# Patient Record
Sex: Female | Born: 1990
Health system: Southern US, Community
[De-identification: ages and names within clinical notes are randomized; demographics above are authoritative.]

## PROBLEM LIST (undated history)

## (undated) DIAGNOSIS — F32A Depression, unspecified: Secondary | ICD-10-CM

## (undated) DIAGNOSIS — D649 Anemia, unspecified: Secondary | ICD-10-CM

## (undated) DIAGNOSIS — F419 Anxiety disorder, unspecified: Secondary | ICD-10-CM

## (undated) HISTORY — DX: Anxiety disorder, unspecified: F41.9

## (undated) HISTORY — DX: Depression, unspecified: F32.A

## (undated) HISTORY — PX: WISDOM TOOTH EXTRACTION: SHX21

## (undated) HISTORY — DX: Anemia, unspecified: D64.9

---

## 2011-01-23 ENCOUNTER — Emergency Department (HOSPITAL_BASED_OUTPATIENT_CLINIC_OR_DEPARTMENT_OTHER)
Admission: EM | Admit: 2011-01-23 | Discharge: 2011-01-23 | Disposition: A | Discharge status: Home / Self Care | Payer: Medicaid Other | Attending: Emergency Medicine | Admitting: Emergency Medicine

## 2011-01-23 ENCOUNTER — Emergency Department (INDEPENDENT_AMBULATORY_CARE_PROVIDER_SITE_OTHER): Payer: Medicaid Other

## 2011-01-23 DIAGNOSIS — M25519 Pain in unspecified shoulder: Secondary | ICD-10-CM | POA: Insufficient documentation

## 2011-01-23 DIAGNOSIS — M79609 Pain in unspecified limb: Secondary | ICD-10-CM | POA: Insufficient documentation

## 2011-01-23 DIAGNOSIS — M542 Cervicalgia: Secondary | ICD-10-CM

## 2011-01-28 ENCOUNTER — Emergency Department (HOSPITAL_BASED_OUTPATIENT_CLINIC_OR_DEPARTMENT_OTHER)
Admission: EM | Admit: 2011-01-28 | Discharge: 2011-01-28 | Disposition: A | Discharge status: Home / Self Care | Payer: Medicaid Other | Attending: Emergency Medicine | Admitting: Emergency Medicine

## 2011-01-28 DIAGNOSIS — M25519 Pain in unspecified shoulder: Secondary | ICD-10-CM | POA: Insufficient documentation

## 2011-01-28 DIAGNOSIS — R209 Unspecified disturbances of skin sensation: Secondary | ICD-10-CM | POA: Insufficient documentation

## 2011-01-28 DIAGNOSIS — M542 Cervicalgia: Secondary | ICD-10-CM | POA: Insufficient documentation

## 2011-01-28 DIAGNOSIS — M79609 Pain in unspecified limb: Secondary | ICD-10-CM | POA: Insufficient documentation

## 2011-01-28 DIAGNOSIS — M62838 Other muscle spasm: Secondary | ICD-10-CM | POA: Insufficient documentation

## 2011-06-02 ENCOUNTER — Emergency Department (HOSPITAL_BASED_OUTPATIENT_CLINIC_OR_DEPARTMENT_OTHER)
Admission: EM | Admit: 2011-06-02 | Discharge: 2011-06-03 | Disposition: A | Discharge status: Home / Self Care | Payer: Medicaid Other | Attending: Emergency Medicine | Admitting: Emergency Medicine

## 2011-06-02 DIAGNOSIS — X58XXXA Exposure to other specified factors, initial encounter: Secondary | ICD-10-CM | POA: Insufficient documentation

## 2011-06-02 DIAGNOSIS — R51 Headache: Secondary | ICD-10-CM | POA: Insufficient documentation

## 2011-06-02 DIAGNOSIS — L089 Local infection of the skin and subcutaneous tissue, unspecified: Secondary | ICD-10-CM | POA: Insufficient documentation

## 2011-06-02 DIAGNOSIS — Y9289 Other specified places as the place of occurrence of the external cause: Secondary | ICD-10-CM | POA: Insufficient documentation

## 2011-06-02 DIAGNOSIS — IMO0002 Reserved for concepts with insufficient information to code with codable children: Secondary | ICD-10-CM | POA: Insufficient documentation

## 2015-12-14 NOTE — L&D Delivery Note (Signed)
Delivery Note At 8:30 PM a viable female, "Emma Spencer", was delivered via Vaginal, Spontaneous Delivery (Presentation: OA restituting to ROA).  APGARS: 8, 9; weight pending.   Placenta status: Spontaneous, intact.  Cord:  WNL, with the following complications: None. Cord pH: NA  Anesthesia: Epidural Episiotomy: None Lacerations: 2nd degree Suture Repair: 2-0 vicryl CT-1, 3-0 vicryl SH Est. Blood Loss (mL): 350  Mom to postpartum.  Baby to Couplet care / Skin to Skin. Mom plans to breastfeed and desires Micronor for contraception.  Takes Zoloft for anxiety and depression, wishes to continue pp.     Sherre ScarletKimberly Theodoros Stjames, CNM 09/15/16, 9:37 PM

## 2016-04-12 ENCOUNTER — Telehealth: Payer: Self-pay | Admitting: Cardiology

## 2016-04-12 NOTE — Telephone Encounter (Signed)
Received [facility] for appointment on 04/19/16 with Dr SwazilandJordan.  [facility] for Dr Elvis CoilJordan's schedule on 04/19/16. lp

## 2016-04-19 ENCOUNTER — Encounter: Payer: Self-pay | Admitting: Cardiology

## 2016-04-19 ENCOUNTER — Ambulatory Visit (INDEPENDENT_AMBULATORY_CARE_PROVIDER_SITE_OTHER): Payer: Medicaid Other | Admitting: Cardiology

## 2016-04-19 VITALS — BP 94/64 | HR 82 | Ht 66.0 in | Wt 187.5 lb

## 2016-04-19 DIAGNOSIS — R002 Palpitations: Secondary | ICD-10-CM | POA: Diagnosis not present

## 2016-04-19 HISTORY — DX: Palpitations: R00.2

## 2016-04-19 NOTE — Progress Notes (Signed)
Cardiology Office Note   Date:  04/19/2016   ID:  Emma Spencer, DOB Jul 04, 1991, MRN [id number]  PCP:  Michael Litter, MD  Cardiologist:   Peter Swaziland, MD   Chief Complaint  Patient presents with  . New Evaluation    having some heart palpitions, has shortness of breath with heart palpitations, no edema, no pain or cramping in legs, has lightheaded and dizziness       History of Present Illness: Emma Spencer is a 25 y.o. female who presents for evaluation of palpitations at the request of Dr. Normand Sloop. She is now [redacted] weeks pregnant with her first pregnancy. She has noted palpitations over the past month. They occur daily. She notes heart racing and pounding. HR increases to 118-119. Symptoms associated with lightheadedness, SOB and feeling tired. No triggers. Relieved with sitting down. Lasts about 10-15 minutes but may last longer if unable to sit. Avoids caffeine and decongestants. She is a non smoker. She is otherwise in excellent health.    Past Medical History  Diagnosis Date  . Anemia     No past surgical history on file.   Current Outpatient Prescriptions  Medication Sig Dispense Refill  . docusate sodium (COLACE) 100 MG capsule Take 100 mg by mouth daily.    . Prenat w/o A-FeCbGl-DSS-FA-DHA (CITRANATAL ASSURE) 35-1 & 300 MG tablet TAKE BOTH CAPS BY MOUTH ONCE DAILY  11  . Prenatal Vit-Fe Fumarate-FA (PRENATAL VITAMIN PLUS LOW IRON) 27-1 MG TABS Take 1 tablet by mouth daily.  11   No current facility-administered medications for this visit.    Allergies:   Review of patient's allergies indicates no known allergies.    Social History:  The patient  reports that she has never smoked. She has never used smokeless tobacco. She reports that she does not drink alcohol.   Family History:  The patient's family history includes Hypertension in her father and mother.    ROS:  Please see the history of present illness.   Otherwise, review of systems are positive for  none.   All other systems are reviewed and negative.    PHYSICAL EXAM: VS:  BP 94/64 mmHg  Pulse 82  Ht  (1.676 m)  Wt 85.049 kg (187 lb 8 oz)  BMI 30.28 kg/m2 , BMI Body mass index is 30.28 kg/(m^2). GEN: Well nourished, well developed, in no acute distress HEENT: normal Neck: no JVD, carotid bruits, or masses Cardiac: RRR; no murmurs, rubs, or gallops,no edema  Respiratory:  clear to auscultation bilaterally, normal work of breathing GI: soft, nontender, nondistended, + BS MS: no deformity or atrophy Skin: warm and dry, no rash Neuro:  Strength and sensation are intact Psych: euthymic mood, full affect   EKG:  EKG is ordered today. The ekg ordered today demonstrates NSR rate 83. Nonspecific TWA. I have personally reviewed and interpreted this study.    Recent Labs: No results found for requested labs within last 365 days.    Lipid Panel No results found for: CHOL, TRIG, HDL, CHOLHDL, VLDL, LDLCALC, LDLDIRECT    Wt Readings from Last 3 Encounters:  04/19/16 85.049 kg (187 lb 8 oz)      Other studies Reviewed: Additional studies/ records that were reviewed today include: None. Review of the above records demonstrates: N/A   ASSESSMENT AND PLAN:  1.  Palpitations. Etiology unclear. Possibly exacerbated by hormonal changes with pregnancy. Will have her wear a Holter monitor for 48 hours to document rhythm. Will have further recommendations  once results known.    Current medicines are reviewed at length with the patient today.  The patient does not have concerns regarding medicines.  The following changes have been made:  no change  Labs/ tests ordered today include:  Orders Placed This Encounter  Procedures  . EKG 12-Lead   48 hour Holter monitor.   Disposition:   FU TBD  Signed, Peter SwazilandJordan, MD  04/19/2016 10:32 AM    [facility] [address], [place], [place], [id number] Phone [phone], Fax [phone]

## 2016-04-19 NOTE — Patient Instructions (Signed)
We will have you wear a monitor.

## 2016-04-26 ENCOUNTER — Ambulatory Visit (INDEPENDENT_AMBULATORY_CARE_PROVIDER_SITE_OTHER): Payer: Medicaid Other

## 2016-04-26 DIAGNOSIS — R002 Palpitations: Secondary | ICD-10-CM | POA: Diagnosis not present

## 2016-05-05 ENCOUNTER — Telehealth: Payer: Self-pay | Admitting: Cardiology

## 2016-05-05 NOTE — Telephone Encounter (Signed)
New message   Pt is calling for 48 hour holter  results

## 2016-05-05 NOTE — Telephone Encounter (Signed)
Received call from patient.Holter monitor results given.

## 2016-05-05 NOTE — Telephone Encounter (Signed)
Returned call to patient no answer.LMTC. 

## 2016-05-11 ENCOUNTER — Other Ambulatory Visit (HOSPITAL_COMMUNITY): Payer: Self-pay | Admitting: Obstetrics and Gynecology

## 2016-05-11 DIAGNOSIS — Z3A23 23 weeks gestation of pregnancy: Secondary | ICD-10-CM

## 2016-05-11 DIAGNOSIS — Z3689 Encounter for other specified antenatal screening: Secondary | ICD-10-CM

## 2016-05-11 DIAGNOSIS — Q049 Congenital malformation of brain, unspecified: Secondary | ICD-10-CM

## 2016-05-14 ENCOUNTER — Encounter (HOSPITAL_COMMUNITY): Payer: Self-pay

## 2016-05-18 ENCOUNTER — Ambulatory Visit (HOSPITAL_COMMUNITY): Admission: RE | Admit: 2016-05-18 | Payer: Medicaid Other | Source: Ambulatory Visit

## 2016-05-18 ENCOUNTER — Ambulatory Visit (HOSPITAL_COMMUNITY)
Admission: RE | Admit: 2016-05-18 | Discharge: 2016-05-18 | Disposition: A | Discharge status: Home / Self Care | Payer: Medicaid Other | Source: Ambulatory Visit | Attending: Obstetrics and Gynecology | Admitting: Obstetrics and Gynecology

## 2016-05-18 ENCOUNTER — Encounter (HOSPITAL_COMMUNITY): Payer: Self-pay

## 2016-05-18 DIAGNOSIS — Z3689 Encounter for other specified antenatal screening: Secondary | ICD-10-CM

## 2016-05-18 DIAGNOSIS — Z36 Encounter for antenatal screening of mother: Secondary | ICD-10-CM | POA: Insufficient documentation

## 2016-05-18 DIAGNOSIS — O358XX0 Maternal care for other (suspected) fetal abnormality and damage, not applicable or unspecified: Secondary | ICD-10-CM | POA: Insufficient documentation

## 2016-05-18 DIAGNOSIS — Z3A23 23 weeks gestation of pregnancy: Secondary | ICD-10-CM | POA: Diagnosis not present

## 2016-05-18 DIAGNOSIS — Q049 Congenital malformation of brain, unspecified: Secondary | ICD-10-CM

## 2016-05-26 ENCOUNTER — Encounter (HOSPITAL_COMMUNITY): Payer: Self-pay

## 2016-05-26 ENCOUNTER — Other Ambulatory Visit (HOSPITAL_COMMUNITY): Payer: Self-pay

## 2016-06-08 LAB — OB RESULTS CONSOLE GC/CHLAMYDIA
CHLAMYDIA, DNA PROBE: NEGATIVE
Gonorrhea: NEGATIVE

## 2016-06-08 LAB — OB RESULTS CONSOLE RPR: RPR: NONREACTIVE

## 2016-06-08 LAB — OB RESULTS CONSOLE ANTIBODY SCREEN: Antibody Screen: NEGATIVE

## 2016-06-08 LAB — OB RESULTS CONSOLE HIV ANTIBODY (ROUTINE TESTING): HIV: NONREACTIVE

## 2016-06-08 LAB — OB RESULTS CONSOLE HEPATITIS B SURFACE ANTIGEN: Hepatitis B Surface Ag: NEGATIVE

## 2016-06-08 LAB — OB RESULTS CONSOLE ABO/RH: RH Type: POSITIVE

## 2016-06-08 LAB — OB RESULTS CONSOLE RUBELLA ANTIBODY, IGM: Rubella: IMMUNE

## 2016-08-27 LAB — OB RESULTS CONSOLE GBS: GBS: NEGATIVE

## 2016-09-14 ENCOUNTER — Encounter (HOSPITAL_COMMUNITY): Payer: Self-pay | Admitting: *Deleted

## 2016-09-14 ENCOUNTER — Inpatient Hospital Stay (HOSPITAL_COMMUNITY)
Admission: AD | Admit: 2016-09-14 | Discharge: 2016-09-14 | Disposition: A | Discharge status: Home / Self Care | Payer: Medicaid Other | Source: Ambulatory Visit | Attending: Obstetrics and Gynecology | Admitting: Obstetrics and Gynecology

## 2016-09-14 MED ORDER — OXYCODONE-ACETAMINOPHEN 5-325 MG PO TABS
2.0000 | ORAL_TABLET | Freq: Once | ORAL | Status: AC
  Administered 2016-09-14: 2 via ORAL
  Filled 2016-09-14: qty 2

## 2016-09-14 NOTE — MAU Note (Signed)
Contractions started this morning around 0700 went to the dr she was 2cm, they have gotten worse denies LOF, some spotting.

## 2016-09-14 NOTE — Discharge Instructions (Signed)
Braxton Hicks Contractions °Contractions of the uterus can occur throughout pregnancy. Contractions are not always a sign that you are in labor.  °WHAT ARE BRAXTON HICKS CONTRACTIONS?  °Contractions that occur before labor are called Braxton Hicks contractions, or false labor. Toward the end of pregnancy (32-34 weeks), these contractions can develop more often and may become more forceful. This is not true labor because these contractions do not result in opening (dilatation) and thinning of the cervix. They are sometimes difficult to tell apart from true labor because these contractions can be forceful and people have different pain tolerances. You should not feel embarrassed if you go to the hospital with false labor. Sometimes, the only way to tell if you are in true labor is for your health care provider to look for changes in the cervix. °If there are no prenatal problems or other health problems associated with the pregnancy, it is completely safe to be sent home with false labor and await the onset of true labor. °HOW CAN YOU TELL THE DIFFERENCE BETWEEN TRUE AND FALSE LABOR? °False Labor °· The contractions of false labor are usually shorter and not as hard as those of true labor.   °· The contractions are usually irregular.   °· The contractions are often felt in the front of the lower abdomen and in the groin.   °· The contractions may go away when you walk around or change positions while lying down.   °· The contractions get weaker and are shorter lasting as time goes on.   °· The contractions do not usually become progressively stronger, regular, and closer together as with true labor.   °True Labor °· Contractions in true labor last 30-70 seconds, become very regular, usually become more intense, and increase in frequency.   °· The contractions do not go away with walking.   °· The discomfort is usually felt in the top of the uterus and spreads to the lower abdomen and low back.   °· True labor can be  determined by your health care provider with an exam. This will show that the cervix is dilating and getting thinner.   °WHAT TO REMEMBER °· Keep up with your usual exercises and follow other instructions given by your health care provider.   °· Take medicines as directed by your health care provider.   °· Keep your regular prenatal appointments.   °· Eat and drink lightly if you think you are going into labor.   °· If Braxton Hicks contractions are making you uncomfortable:   °¨ Change your position from lying down or resting to walking, or from walking to resting.   °¨ Sit and rest in a tub of warm water.   °¨ Drink 2-3 glasses of water. Dehydration may cause these contractions.   °¨ Do slow and deep breathing several times an hour.   °WHEN SHOULD I SEEK IMMEDIATE MEDICAL CARE? °Seek immediate medical care if: °· Your contractions become stronger, more regular, and closer together.   °· You have fluid leaking or gushing from your vagina.   °· You have a fever.   °· You pass blood-tinged mucus.   °· You have vaginal bleeding.   °· You have continuous abdominal pain.   °· You have low back pain that you never had before.   °· You feel your baby's head pushing down and causing pelvic pressure.   °· Your baby is not moving as much as it used to.   °  °This information is not intended to replace advice given to you by your health care provider. Make sure you discuss any questions you have with your health care   provider. °  °Document Released: 11/29/2005 Document Revised: 12/04/2013 Document Reviewed: 09/10/2013 °Elsevier Interactive Patient Education ©2016 [employer] ° °

## 2016-09-15 ENCOUNTER — Inpatient Hospital Stay (HOSPITAL_COMMUNITY)
Admission: AD | Admit: 2016-09-15 | Discharge: 2016-09-17 | DRG: 775 | Disposition: A | Discharge status: Home / Self Care | Payer: Medicaid Other | Source: Ambulatory Visit | Attending: Obstetrics & Gynecology | Admitting: Obstetrics & Gynecology

## 2016-09-15 ENCOUNTER — Inpatient Hospital Stay (HOSPITAL_COMMUNITY): Payer: Medicaid Other | Admitting: Anesthesiology

## 2016-09-15 ENCOUNTER — Encounter (HOSPITAL_COMMUNITY): Payer: Self-pay | Admitting: *Deleted

## 2016-09-15 DIAGNOSIS — F418 Other specified anxiety disorders: Secondary | ICD-10-CM | POA: Diagnosis present

## 2016-09-15 DIAGNOSIS — Z3A40 40 weeks gestation of pregnancy: Secondary | ICD-10-CM | POA: Diagnosis not present

## 2016-09-15 DIAGNOSIS — Z8659 Personal history of other mental and behavioral disorders: Secondary | ICD-10-CM

## 2016-09-15 DIAGNOSIS — Z3403 Encounter for supervision of normal first pregnancy, third trimester: Secondary | ICD-10-CM | POA: Diagnosis present

## 2016-09-15 DIAGNOSIS — Z8249 Family history of ischemic heart disease and other diseases of the circulatory system: Secondary | ICD-10-CM | POA: Diagnosis not present

## 2016-09-15 DIAGNOSIS — O99344 Other mental disorders complicating childbirth: Secondary | ICD-10-CM | POA: Diagnosis present

## 2016-09-15 LAB — CBC
HEMATOCRIT: 37.6 % (ref 36.0–46.0)
HEMOGLOBIN: 13.1 g/dL (ref 12.0–15.0)
MCH: 29.2 pg (ref 26.0–34.0)
MCHC: 34.8 g/dL (ref 30.0–36.0)
MCV: 83.7 fL (ref 78.0–100.0)
Platelets: 327 10*3/uL (ref 150–400)
RBC: 4.49 MIL/uL (ref 3.87–5.11)
RDW: 15.2 % (ref 11.5–15.5)
WBC: 14.2 10*3/uL — AB (ref 4.0–10.5)

## 2016-09-15 LAB — TYPE AND SCREEN
ABO/RH(D): O POS
Antibody Screen: NEGATIVE

## 2016-09-15 LAB — ABO/RH: ABO/RH(D): O POS

## 2016-09-15 MED ORDER — SOD CITRATE-CITRIC ACID 500-334 MG/5ML PO SOLN: 30.0000 mL | ORAL | Status: DC | PRN

## 2016-09-15 MED ORDER — ACETAMINOPHEN 325 MG PO TABS: 650.0000 mg | ORAL_TABLET | ORAL | Status: DC | PRN

## 2016-09-15 MED ORDER — OXYTOCIN 40 UNITS IN LACTATED RINGERS INFUSION - SIMPLE MED
1.0000 m[IU]/min | INTRAVENOUS | Status: DC
  Administered 2016-09-15: 2 m[IU]/min via INTRAVENOUS

## 2016-09-15 MED ORDER — PHENYLEPHRINE 40 MCG/ML (10ML) SYRINGE FOR IV PUSH (FOR BLOOD PRESSURE SUPPORT)
80.0000 ug | PREFILLED_SYRINGE | INTRAVENOUS | Status: DC | PRN
  Filled 2016-09-15: qty 5
  Filled 2016-09-15: qty 10

## 2016-09-15 MED ORDER — LACTATED RINGERS IV SOLN: 500.0000 mL | Freq: Once | INTRAVENOUS | Status: DC

## 2016-09-15 MED ORDER — TERBUTALINE SULFATE 1 MG/ML IJ SOLN
0.2500 mg | Freq: Once | INTRAMUSCULAR | Status: DC | PRN
  Filled 2016-09-15: qty 1

## 2016-09-15 MED ORDER — ONDANSETRON HCL 4 MG/2ML IJ SOLN: 4.0000 mg | Freq: Four times a day (QID) | INTRAMUSCULAR | Status: DC | PRN

## 2016-09-15 MED ORDER — DIPHENHYDRAMINE HCL 50 MG/ML IJ SOLN: 12.5000 mg | INTRAMUSCULAR | Status: DC | PRN

## 2016-09-15 MED ORDER — PHENYLEPHRINE 40 MCG/ML (10ML) SYRINGE FOR IV PUSH (FOR BLOOD PRESSURE SUPPORT)
80.0000 ug | PREFILLED_SYRINGE | INTRAVENOUS | Status: DC | PRN
  Filled 2016-09-15: qty 5

## 2016-09-15 MED ORDER — LIDOCAINE HCL (PF) 1 % IJ SOLN
INTRAMUSCULAR | Status: DC | PRN
  Administered 2016-09-15 (×2): 4 mL

## 2016-09-15 MED ORDER — OXYCODONE-ACETAMINOPHEN 5-325 MG PO TABS: 2.0000 | ORAL_TABLET | ORAL | Status: DC | PRN

## 2016-09-15 MED ORDER — EPHEDRINE 5 MG/ML INJ
10.0000 mg | INTRAVENOUS | Status: DC | PRN
  Filled 2016-09-15: qty 4

## 2016-09-15 MED ORDER — FENTANYL 2.5 MCG/ML BUPIVACAINE 1/10 % EPIDURAL INFUSION (WH - ANES)
14.0000 mL/h | INTRAMUSCULAR | Status: DC | PRN
  Administered 2016-09-15 (×2): 14 mL/h via EPIDURAL
  Filled 2016-09-15 (×2): qty 125

## 2016-09-15 MED ORDER — FENTANYL 2.5 MCG/ML BUPIVACAINE 1/10 % EPIDURAL INFUSION (WH - ANES): 14.0000 mL/h | INTRAMUSCULAR | Status: DC | PRN

## 2016-09-15 MED ORDER — OXYCODONE-ACETAMINOPHEN 5-325 MG PO TABS: 1.0000 | ORAL_TABLET | ORAL | Status: DC | PRN

## 2016-09-15 MED ORDER — LACTATED RINGERS IV SOLN
INTRAVENOUS | Status: DC
  Administered 2016-09-15: 11:00:00 via INTRAVENOUS
  Administered 2016-09-15: 125 mL/h via INTRAVENOUS

## 2016-09-15 MED ORDER — OXYTOCIN BOLUS FROM INFUSION: 500.0000 mL | Freq: Once | INTRAVENOUS | Status: DC

## 2016-09-15 MED ORDER — LACTATED RINGERS IV SOLN
500.0000 mL | INTRAVENOUS | Status: DC | PRN
  Administered 2016-09-15 (×2): 500 mL via INTRAVENOUS

## 2016-09-15 MED ORDER — OXYTOCIN 40 UNITS IN LACTATED RINGERS INFUSION - SIMPLE MED
2.5000 [IU]/h | INTRAVENOUS | Status: DC
  Administered 2016-09-15: 2.5 [IU]/h via INTRAVENOUS
  Filled 2016-09-15: qty 1000

## 2016-09-15 MED ORDER — LIDOCAINE HCL (PF) 1 % IJ SOLN
30.0000 mL | INTRAMUSCULAR | Status: DC | PRN
  Filled 2016-09-15: qty 30

## 2016-09-15 MED ORDER — IBUPROFEN 600 MG PO TABS
600.0000 mg | ORAL_TABLET | Freq: Four times a day (QID) | ORAL | Status: DC
  Administered 2016-09-15 – 2016-09-17 (×7): 600 mg via ORAL
  Filled 2016-09-15 (×7): qty 1

## 2016-09-15 NOTE — H&P (Signed)
Emma MoseMelissa Spencer is a 25 y.o. female presenting for labor.G1P0 @ 2040 W 2 D presented with contractions.  Denies vaginal bleeding or leakage of fluid.  Has just received epidural. LMP 12/08/15/  [facility]    OB History    Gravida Para Term Preterm AB Living   1         0   SAB TAB Ectopic Multiple Live Births                 Past Medical History:  Diagnosis Date  . Anemia    History reviewed. No pertinent surgical history. Family History: family history includes Hypertension in her father and mother. Social History:  reports that she has never smoked. She has never used smokeless tobacco. She reports that she does not drink alcohol or use drugs.     Maternal Diabetes: No Genetic Screening: Normal Maternal Ultrasounds/Referrals: Abnormal:  Findings:   Other:  Fetus with enlarged cisterna magnum, subsequent ultrasound at [facility] showed normal anatomy.  Last EFW8/15/17: 5lb 4 oz (74th%).  Fetal Ultrasounds or other Referrals:  None Maternal Substance Abuse:  No Significant Maternal Medications:  Meds include: Zoloft for depression and anxiety.  Significant Maternal Lab Results:  None Other Comments:  None  ROS  Constitutional: Denies fevers/chills Cardiovascular: Denies chest pain or palpitations Pulmonary: Denies coughing or wheezing Gastrointestinal: Denies nausea, vomiting or diarrhea Genitourinary: Denies pelvic pain, unusual vaginal bleeding, unusual vaginal discharge, dysuria, urgency or frequency. +uterine contractions.  Musculoskeletal: Denies muscle or joint aches and pain.  Neurology: Denies abnormal sensations such as tingling or numbness.   History Dilation: 7 Effacement (%): 80 Station: -1 Exam by:: MD Kaiyon Hynes  AROM performed @ 12.20 pm: blood tinged fluid, small amount.  Blood pressure 126/64, pulse 69, temperature 98.5 F (36.9 C), temperature source Oral, resp. rate 16, height 5\' 6"  (1.676 m), weight 98 kg (216 lb), last menstrual period 12/08/2015, SpO2 100  %. Exam Physical Exam  General: No acute distress Abdomen: Soft, gravid Extremities; Warm and well perfused. No edema.  NST:  130 BL, mod variability, reactive TOCO: Q 4-5 minutes apart EFW 7.5 lbs.    Prenatal labs: ABO, Rh: --/--/O POS (10/04 1037) Antibody: NEG (10/04 1037) Rubella: Immune (06/27 0000) RPR: Nonreactive (06/27 0000)  HBsAg: Negative (06/27 0000)  HIV: Non-reactive (06/27 0000)  GBS: Negative (09/15 0000)    Assessment/Plan: 25 y/o G1P0 in labor,   Admit to Labor and delivery Anticipate NSVD   Konrad FelixKULWA,Trask Vosler WAKURU, MD.  09/15/2016, 12:29 PM

## 2016-09-15 NOTE — Anesthesia Pain Management Evaluation Note (Signed)
  CRNA Pain Management Visit Note  Patient: Emma MoseMelissa Ratledge, 25 y.o., female  "Hello I am a member of the anesthesia team at [facility]. We have an anesthesia team available at all times to provide care throughout the hospital, including epidural management and anesthesia for C-section. I don't know your plan for the delivery whether it a natural birth, water birth, IV sedation, nitrous supplementation, doula or epidural, but we want to meet your pain goals."   1.Was your pain managed to your expectations on prior hospitalizations?   No prior hospitalizations  2.What is your expectation for pain management during this hospitalization?     Epidural  3.How can we help you reach that goal? Epidural in situ.  Record the patient's initial score and the patient's pain goal.   Pain: 7--epidural placed recently, pain decreasing to more tolerable, L&D RN at bedside  Pain Goal: 6 [facility] wants you to be able to say your pain was always managed very well.  Lasha Echeverria L 09/15/2016

## 2016-09-15 NOTE — MAU Note (Signed)
Was seen in MAU yesterday with UC's. States contractions have continued. Noticed pink tinged mucous, no leaking.

## 2016-09-15 NOTE — Anesthesia Procedure Notes (Addendum)
Epidural Patient location during procedure: OB  Staffing Anesthesiologist: Jazzie Trampe Performed: anesthesiologist   Preanesthetic Checklist Completed: patient identified, pre-op evaluation, timeout performed, IV checked, risks and benefits discussed and monitors and equipment checked  Epidural Patient position: sitting Prep: site prepped and draped and DuraPrep Patient monitoring: heart rate Approach: midline Location: L2-L3 Injection technique: LOR air and LOR saline  Needle:  Needle type: Tuohy  Needle gauge: 17 G Needle length: 9 cm Needle insertion depth: 6 cm Catheter type: closed end flexible Catheter size: 19 Gauge Catheter at skin depth: 12 cm Test dose: negative  Assessment Sensory level: T8 Events: blood not aspirated, injection not painful, no injection resistance, negative IV test and no paresthesia  Additional Notes Reason for block:procedure for pain     

## 2016-09-15 NOTE — Progress Notes (Signed)
C/o dizziness when getting OOB to bathroom with near fall. Patient assisted to wheelchair from stedy by spouse and RN. Vitals obtained, all WNL. 500cc IVF bolus administered. Will continue to monitor.

## 2016-09-15 NOTE — Anesthesia Preprocedure Evaluation (Signed)
Anesthesia Evaluation  Patient identified by MRN, date of birth, ID band Patient awake    Reviewed: Allergy & Precautions, NPO status , Patient's Chart, lab work & pertinent test results  Airway Mallampati: II  TM Distance: >3 FB Neck ROM: Full    Dental no notable dental hx.    Pulmonary neg pulmonary ROS,    Pulmonary exam normal breath sounds clear to auscultation       Cardiovascular negative cardio ROS Normal cardiovascular exam Rhythm:Regular Rate:Normal     Neuro/Psych negative neurological ROS  negative psych ROS   GI/Hepatic negative GI ROS, Neg liver ROS,   Endo/Other  negative endocrine ROS  Renal/GU negative Renal ROS     Musculoskeletal negative musculoskeletal ROS (+)   Abdominal (+) + obese,   Peds  Hematology  (+) anemia ,   Anesthesia Other Findings   Reproductive/Obstetrics (+) Pregnancy                             Anesthesia Physical Anesthesia Plan  ASA: II  Anesthesia Plan: Epidural   Post-op Pain Management:    Induction:   Airway Management Planned:   Additional Equipment:   Intra-op Plan:   Post-operative Plan:   Informed Consent: I have reviewed the patients History and Physical, chart, labs and discussed the procedure including the risks, benefits and alternatives for the proposed anesthesia with the patient or authorized representative who has indicated his/her understanding and acceptance.     Plan Discussed with:   Anesthesia Plan Comments:         Anesthesia Quick Evaluation  

## 2016-09-15 NOTE — Anesthesia Rounding Note (Signed)
  CRNA Epidural Rounding Note  Patient: Emma Spencer, 25 y.o., female  Patient's current pain level: Pain Score: 7  (09/15/16 1436)  Agreed upon pain management level: 6  Epidural intervention: No   Comments:   Dorthey Depace 09/15/2016

## 2016-09-16 LAB — CBC
HEMATOCRIT: 31.1 % — AB (ref 36.0–46.0)
HEMOGLOBIN: 10.6 g/dL — AB (ref 12.0–15.0)
MCH: 28.7 pg (ref 26.0–34.0)
MCHC: 34.1 g/dL (ref 30.0–36.0)
MCV: 84.3 fL (ref 78.0–100.0)
Platelets: 269 10*3/uL (ref 150–400)
RBC: 3.69 MIL/uL — AB (ref 3.87–5.11)
RDW: 15.7 % — ABNORMAL HIGH (ref 11.5–15.5)
WBC: 15.5 10*3/uL — AB (ref 4.0–10.5)

## 2016-09-16 LAB — HIV ANTIBODY (ROUTINE TESTING W REFLEX): HIV Screen 4th Generation wRfx: NONREACTIVE

## 2016-09-16 LAB — RPR: RPR: NONREACTIVE

## 2016-09-16 MED ORDER — DIPHENHYDRAMINE HCL 25 MG PO CAPS
25.0000 mg | ORAL_CAPSULE | Freq: Four times a day (QID) | ORAL | Status: DC | PRN
Start: 2016-09-16 — End: 2016-09-17

## 2016-09-16 MED ORDER — COCONUT OIL OIL
1.0000 "application " | TOPICAL_OIL | Status: DC | PRN
  Filled 2016-09-16: qty 120

## 2016-09-16 MED ORDER — SIMETHICONE 80 MG PO CHEW: 80.0000 mg | CHEWABLE_TABLET | ORAL | Status: DC | PRN

## 2016-09-16 MED ORDER — FERROUS SULFATE 325 (65 FE) MG PO TABS
325.0000 mg | ORAL_TABLET | Freq: Two times a day (BID) | ORAL | Status: DC
  Administered 2016-09-16 – 2016-09-17 (×3): 325 mg via ORAL
  Filled 2016-09-16 (×3): qty 1

## 2016-09-16 MED ORDER — WITCH HAZEL-GLYCERIN EX PADS: 1.0000 "application " | MEDICATED_PAD | CUTANEOUS | Status: DC | PRN

## 2016-09-16 MED ORDER — ONDANSETRON HCL 4 MG PO TABS: 4.0000 mg | ORAL_TABLET | ORAL | Status: DC | PRN

## 2016-09-16 MED ORDER — OXYCODONE-ACETAMINOPHEN 5-325 MG PO TABS: 1.0000 | ORAL_TABLET | ORAL | Status: DC | PRN

## 2016-09-16 MED ORDER — PRENATAL MULTIVITAMIN CH
1.0000 | ORAL_TABLET | Freq: Every day | ORAL | Status: DC
  Administered 2016-09-16 – 2016-09-17 (×2): 1 via ORAL
  Filled 2016-09-16 (×2): qty 1

## 2016-09-16 MED ORDER — TETANUS-DIPHTH-ACELL PERTUSSIS 5-2.5-18.5 LF-MCG/0.5 IM SUSP: 0.5000 mL | Freq: Once | INTRAMUSCULAR | Status: DC

## 2016-09-16 MED ORDER — SENNOSIDES-DOCUSATE SODIUM 8.6-50 MG PO TABS
2.0000 | ORAL_TABLET | ORAL | Status: DC
  Administered 2016-09-16: 2 via ORAL
  Filled 2016-09-16: qty 2

## 2016-09-16 MED ORDER — DIBUCAINE 1 % RE OINT: 1.0000 "application " | TOPICAL_OINTMENT | RECTAL | Status: DC | PRN

## 2016-09-16 MED ORDER — ONDANSETRON HCL 4 MG/2ML IJ SOLN: 4.0000 mg | INTRAMUSCULAR | Status: DC | PRN

## 2016-09-16 MED ORDER — SERTRALINE HCL 50 MG PO TABS
50.0000 mg | ORAL_TABLET | Freq: Every day | ORAL | Status: DC
  Administered 2016-09-16 – 2016-09-17 (×2): 50 mg via ORAL
  Filled 2016-09-16 (×3): qty 1

## 2016-09-16 MED ORDER — BENZOCAINE-MENTHOL 20-0.5 % EX AERO
1.0000 "application " | INHALATION_SPRAY | CUTANEOUS | Status: DC | PRN
  Administered 2016-09-16: 1 via TOPICAL
  Filled 2016-09-16: qty 56

## 2016-09-16 MED ORDER — OXYCODONE-ACETAMINOPHEN 5-325 MG PO TABS: 2.0000 | ORAL_TABLET | ORAL | Status: DC | PRN

## 2016-09-16 MED ORDER — ZOLPIDEM TARTRATE 5 MG PO TABS: 5.0000 mg | ORAL_TABLET | Freq: Every evening | ORAL | Status: DC | PRN

## 2016-09-16 MED ORDER — ACETAMINOPHEN 325 MG PO TABS: 650.0000 mg | ORAL_TABLET | ORAL | Status: DC | PRN

## 2016-09-16 NOTE — Progress Notes (Signed)
Post Partum Day 1 Subjective:  Well. Lochia are normal. Voiding, ambulating, tolerating normal diet. nursing going well.  Objective: Blood pressure (!) 115/59, pulse 79, temperature 98.2 F (36.8 C), resp. rate 18, height 5\' 6"  (1.676 m), weight 216 lb (98 kg), last menstrual period 12/08/2015, SpO2 99 %, unknown if currently breastfeeding.  Physical Exam:  General: normal Lochia: appropriate Uterine Fundus: 0/2 firm non-tender  Extremities: No evidence of DVT seen on physical exam. Edema minimal     Recent Labs  09/15/16 1037  HGB 13.1  HCT 37.6    Assessment/Plan: Normal Post-partum. Continue routine post-partum care. Anticipate discharge tomorrow    LOS: 1 day   Dmarcus Decicco A MD 09/16/2016, 7:57 AM

## 2016-09-16 NOTE — Anesthesia Postprocedure Evaluation (Signed)
Anesthesia Post Note  Patient: Emma Spencer  Procedure(s) Performed: * No procedures listed *  Patient location during evaluation: Mother Baby Anesthesia Type: Epidural Level of consciousness: awake Pain management: pain level controlled Vital Signs Assessment: post-procedure vital signs reviewed and stable Respiratory status: spontaneous breathing Cardiovascular status: stable Postop Assessment: no headache, no backache, spinal receding and patient able to bend at knees Anesthetic complications: no     Last Vitals:  Vitals:   09/16/16 0100 09/16/16 0539  BP: (!) 114/58 (!) 115/59  Pulse: 89 79  Resp: 20 18  Temp: 36.7 C 36.8 C    Last Pain:  Vitals:   09/16/16 0539  TempSrc:   PainSc: 4    Pain Goal:                 Edison PaceWILKERSON,Islah Eve

## 2016-09-16 NOTE — Lactation Note (Signed)
This note was copied from a baby's chart. Lactation Consultation Note  Patient Name: [name] Today's Date: 09/16/2016 Reason for consult: Initial assessment   P1 reports baby feeding well but mom having some discomfort when baby is at breast.   Baby was placed STS with mom.  Mom was shown hand expression; colostrum noted afterwards.  Baby was put into football position on left side and baby latched well but mom reported pain.  Mom was taught how to unlatch baby if pinching or pain felt or if baby's latch felt too shallow.  LC repositioned baby into cross cradle hold.  Mom reports feeling better in this position on the left side.  LC used jaw massage to assist in widening the baby's latch when at breast.  Good suck swallow pattern observe when at breast.  BF basics reviewed with FOB and mom.  Lactation pamphlet given to mom along with BF resource sheet. OP LC resources explained to mom. LC encouraged mom  to call out with concerns or questions with breastfeeding.        Maternal Data Has patient been taught Hand Expression?: Yes Does the patient have breastfeeding experience prior to this delivery?: No  Feeding Feeding Type: Breast Fed Length of feed: 20 min (still nursing when LC left room)  LATCH Score/Interventions Latch: Grasps breast easily, tongue down, lips flanged, rhythmical sucking. Intervention(s): Adjust position;Assist with latch;Breast massage;Breast compression (changed from football to cross. c/ for pt. comfort)  Audible Swallowing: A few with stimulation Intervention(s): Skin to skin;Hand expression;Alternate breast massage (colostrum see with hand exp. and alt.breast colostrum seen when nursing)  Type of Nipple: Everted at rest and after stimulation  Comfort (Breast/Nipple): Filling, red/small blisters or bruises, mild/mod discomfort (mom expresses some discomfort/ enc. ebm to nipples)  Problem noted: Mild/Moderate discomfort Interventions (Mild/moderate  discomfort): Hand expression;Hand massage  Hold (Positioning): Assistance needed to correctly position infant at breast and maintain latch.  LATCH Score: 7  Lactation Tools Discussed/Used     Consult Status Consult Status: Follow-up Date: 09/16/16 Follow-up type: In-patient    Emma Spencer 09/16/2016, 3:14 PM

## 2016-09-16 NOTE — Progress Notes (Signed)
UR chart review completed.  

## 2016-09-16 NOTE — Discharge Instructions (Signed)

## 2016-09-17 MED ORDER — IBUPROFEN 600 MG PO TABS: 600.0000 mg | ORAL_TABLET | Freq: Four times a day (QID) | ORAL | 0 refills | Status: DC

## 2016-09-17 NOTE — Discharge Summary (Signed)
Obstetric Discharge Summary Reason for Admission: onset of labor Prenatal Procedures: none Intrapartum Procedures: spontaneous vaginal delivery Postpartum Procedures: none Complications-Operative and Postpartum: 2 degree perineal laceration  Delivery Note At 8:30 PM a viable female, "Emma Spencer",was delivered via Vaginal, Spontaneous Delivery (Presentation: OA restituting to ROA). APGARS: 8, 9; weight pending.  Placenta status: Spontaneous, intact.Cord: WNL,with the following complications: None. Cord pH: NA  Anesthesia: Epidural Episiotomy: None Lacerations: 2nd degree Suture Repair: 2-0 vicryl CT-1, 3-0 vicryl SH Est. Blood Loss (mL): 350  Mom to postpartum. Baby to Couplet care / Skin to Skin. Mom plans to breastfeed and desires Micronor for contraception.  Takes Zoloft for anxiety and depression, wishes to continue pp.     Emma Spencer, CNM 09/15/16, 9:37 PM  Hospital Course:  Active Problems:   Second-degree perineal laceration, with delivery   H/O: depression (controlled w/ Zoloft)   H/O anxiety disorder (controlled w/ Zoloft)   Emma Spencer is a 25 y.o. G1P1001 s/p SVD.  Patient was admitted 09/15/16.  She has postpartum course that was uncomplicated including no problems with ambulating, PO intake, urination, pain, or bleeding. The pt feels ready to go home and  will be discharged with outpatient follow-up.   Today: No acute events overnight.  Pt denies problems with ambulating, voiding or po intake.  She denies nausea or vomiting.  Pain is well controlled.  She has had flatus. She has not had bowel movement.  Lochia Small.  Plan for birth control is oral progesterone-only contraceptive.  Method of Feeding: breast  Physical Exam:  BP (!) 116/48 (BP Location: Right Arm)   Pulse 82   Temp 97.7 F (36.5 C) (Oral)   Resp 17   Ht 5\' 6"  (1.676 m)   Wt 216 lb (98 kg)   LMP 12/08/2015   SpO2 99%   Breastfeeding? Unknown   BMI 34.86 kg/m   General: alert and cooperative Lochia: appropriate Uterine Fundus: firm Incision:  DVT Evaluation: No evidence of DVT seen on physical exam.  H/H: Lab Results  Component Value Date/Time   HGB 10.6 (L) 09/16/2016 05:13 AM   HCT 31.1 (L) 09/16/2016 05:13 AM    Discharge Diagnoses: Term Pregnancy-delivered  Discharge Information: Date: 09/17/2016 Activity: pelvic rest Diet: routine  Medications: Ibuprofen Breast feeding:  Yes Condition: stable Instructions: refer to handout Discharge to: home      Medication List    TAKE these medications   CITRANATAL ASSURE 35-1 & 300 MG tablet Take 2 tablets by mouth daily.   docusate sodium 100 MG capsule Commonly known as:  COLACE Take 100 mg by mouth daily.   ibuprofen 600 MG tablet Commonly known as:  ADVIL,MOTRIN Take 1 tablet (600 mg total) by mouth every 6 (six) hours.   sertraline 50 MG tablet Commonly known as:  ZOLOFT Take 50 mg by mouth daily.      Follow-up Information    [facility]. Schedule an appointment as soon as possible for a visit in 6 week(s).   Specialty:  Obstetrics and Gynecology Contact information: [address] [place] [id number] [phone]           Emma PinkLori A Spencer, CNM 09/17/2016,6:37 AM

## 2016-09-17 NOTE — Progress Notes (Signed)
MOB was referred for history of depression/anxiety. * Referral screened out by Clinical Social Worker because none of the following criteria appear to apply: ~ History of anxiety/depression during this pregnancy, or of post-partum depression. ~ Diagnosis of anxiety and/or depression within last 3 years OR * MOB's symptoms currently being treated with medication and/or therapy. Please contact the Clinical Social Worker if needs arise, or if MOB requests.  MOB has Rx for Zoloft.   

## 2016-09-17 NOTE — Lactation Note (Signed)
This note was copied from a baby's chart. Lactation Consultation Note: Mom reports baby fed a lot through the night. Reassurance given. Reports nipples are sore- they look intact- reviewed wide open mouth and keeping the  the baby close to the breast  throughout the feeding. Mom going back to work- reviewed pumping for back to work  Has Medela pump for home, Baby latched well and mom reports some pain with initial latched than then eases off/  Baby still nursing as I left room. Reviewed our phone number to call with questions, OP appointments and BFSG as resources for support after DC. To call prn.  Patient Name: Emma Spencer [id number] Date: 09/17/2016 Reason for consult: Follow-up assessment   Maternal Data Formula Feeding for Exclusion: No Has patient been taught Hand Expression?: Yes Does the patient have breastfeeding experience prior to this delivery?: No  Feeding Feeding Type: Breast Fed  LATCH Score/Interventions Latch: Grasps breast easily, tongue down, lips flanged, rhythmical sucking.  Audible Swallowing: A few with stimulation  Type of Nipple: Everted at rest and after stimulation  Comfort (Breast/Nipple): Filling, red/small blisters or bruises, mild/mod discomfort  Problem noted: Mild/Moderate discomfort Interventions (Mild/moderate discomfort): Hand expression  Hold (Positioning): Assistance needed to correctly position infant at breast and maintain latch.  LATCH Score: 7  Lactation Tools Discussed/Used WIC Program: No   Consult Status Consult Status: Complete    Pamelia Hoit 09/17/2016, 10:21 AM

## 2016-09-17 NOTE — Plan of Care (Signed)
Problem: Education: Goal: Knowledge of condition will improve Outcome: Completed/Met Date Met: 09/17/16 Discharge education and paperwork discussed. Reasons to call the MD discussed. Pt denies questions.

## 2016-12-10 ENCOUNTER — Encounter: Payer: Self-pay | Admitting: Cardiology

## 2017-09-12 IMAGING — US US MFM OB DETAIL+14 WK
1 series · 14 of 28 positions shown · non-contrast
Comparison: none

[Series 1: us mfm ob detail+14 wk · 122 acquisitions, 14 frames shown]
[im 5/122]
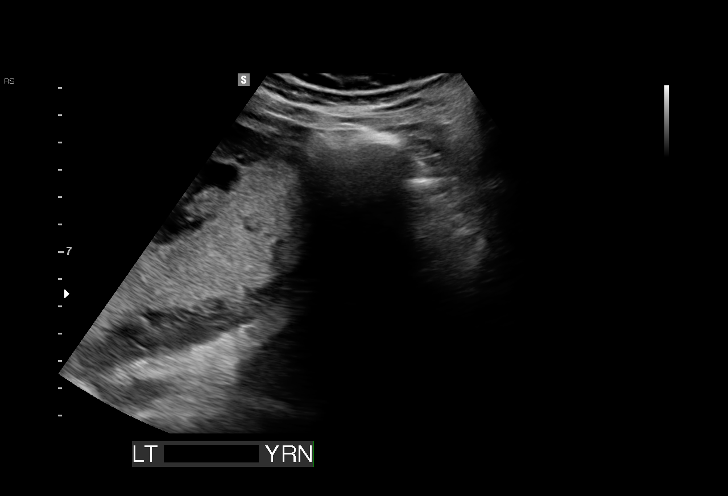
[im 14/122]
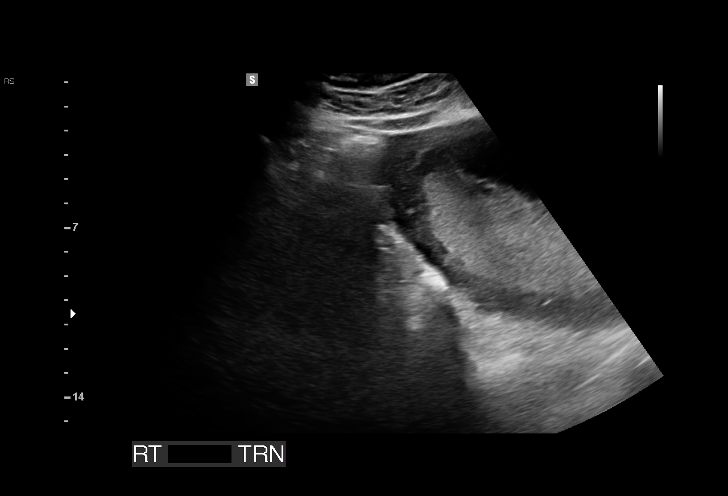
[im 23/122]
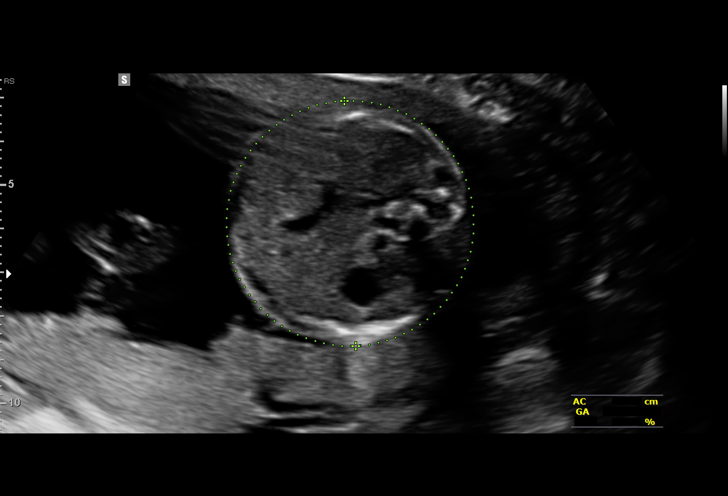
[im 32/122]
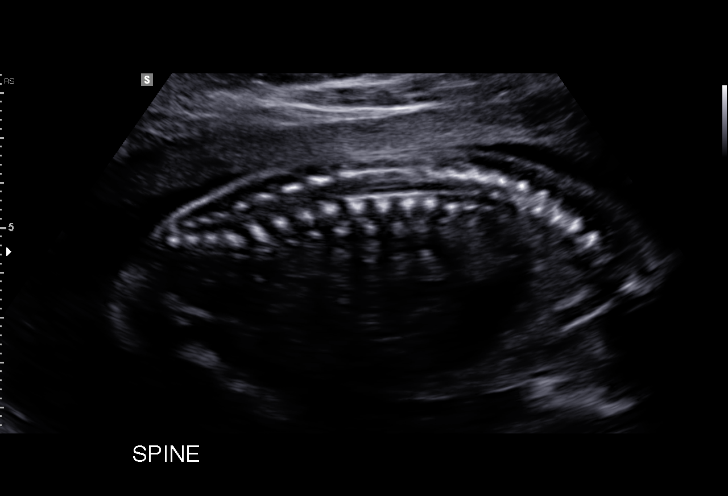
[im 41/122]
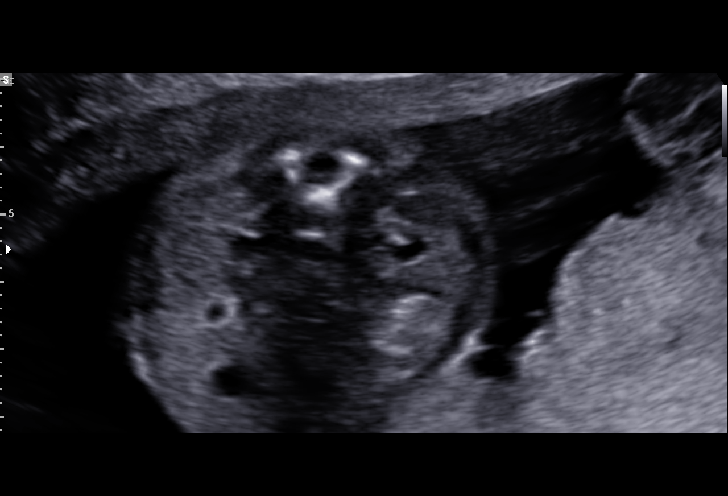
[im 50/122]
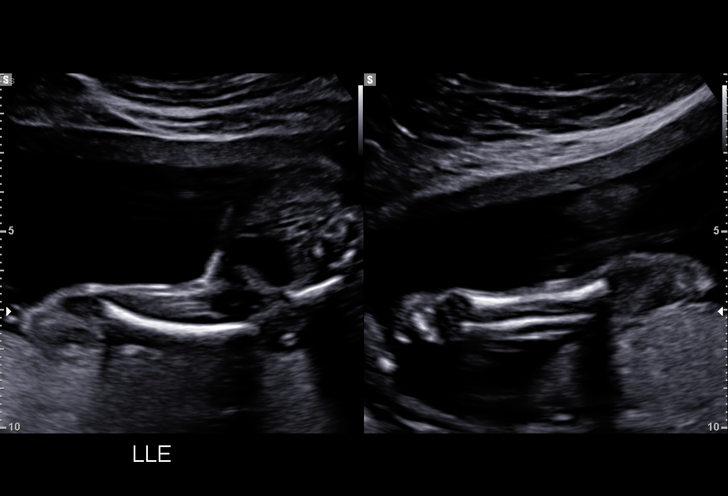
[im 59/122]
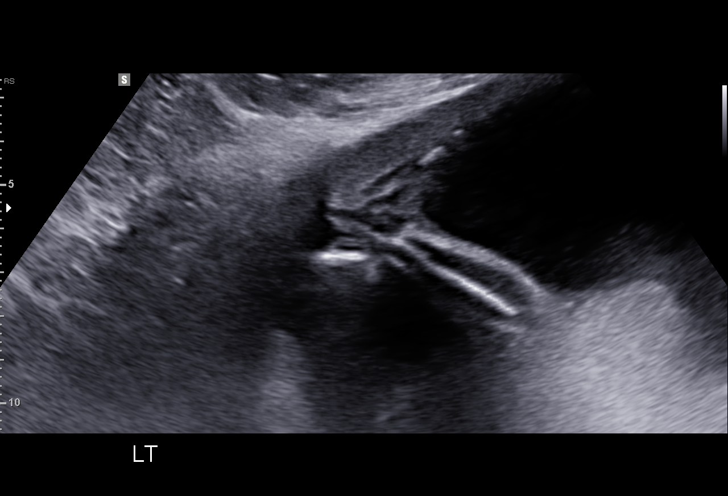
[im 68/122]
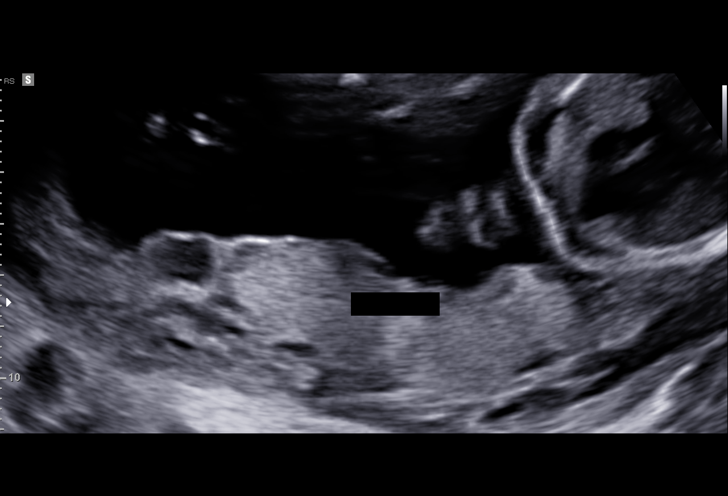
[im 77/122]
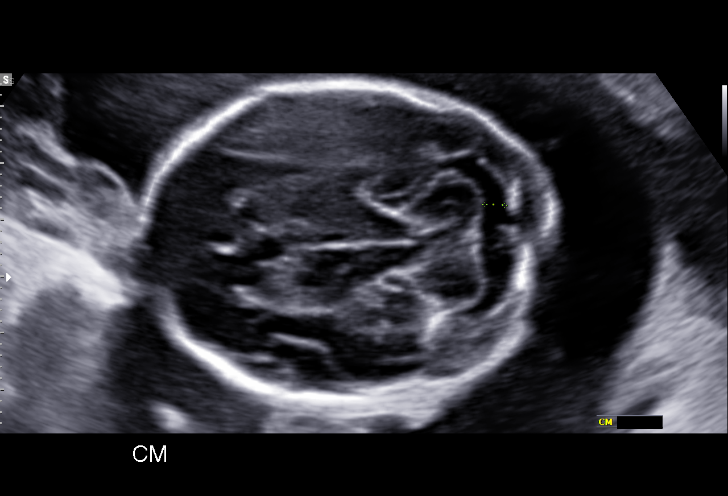
[im 86/122]
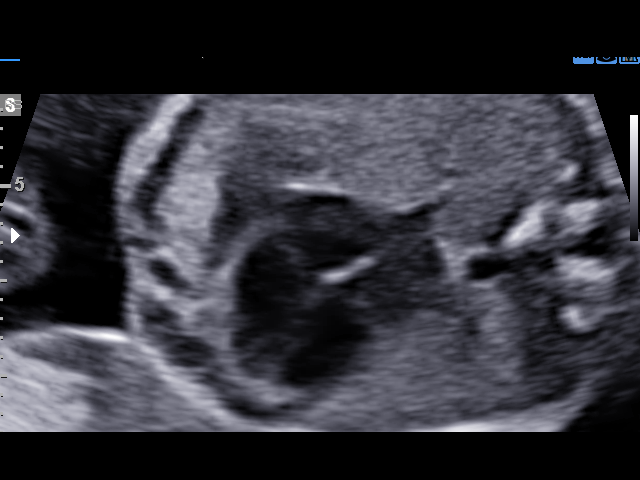
[im 95/122]
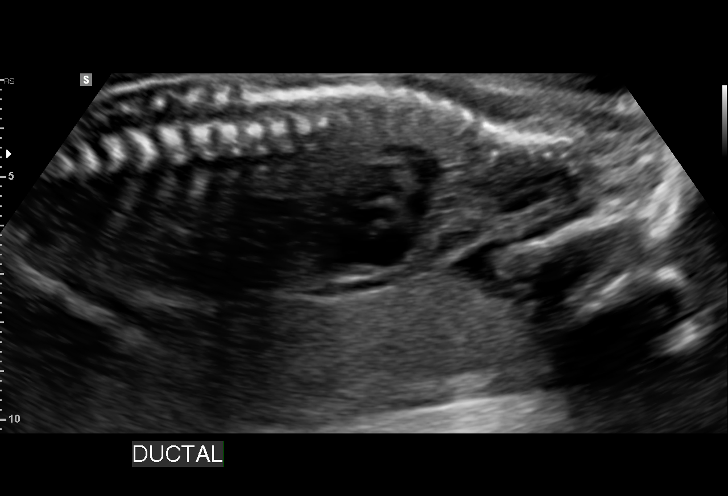
[im 104/122]
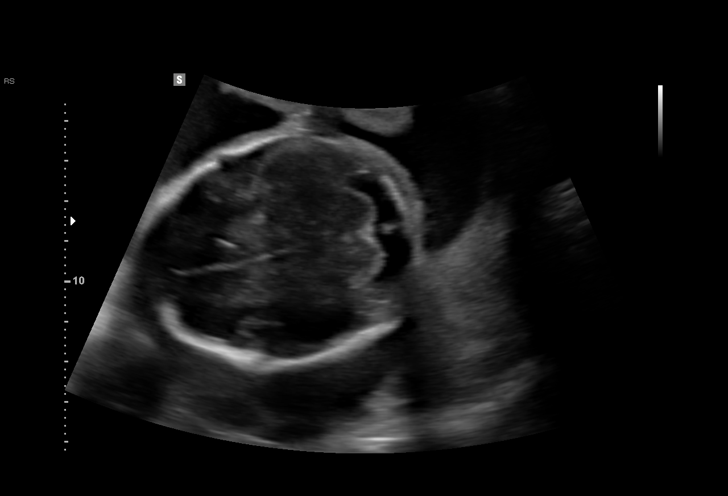
[im 113/122]
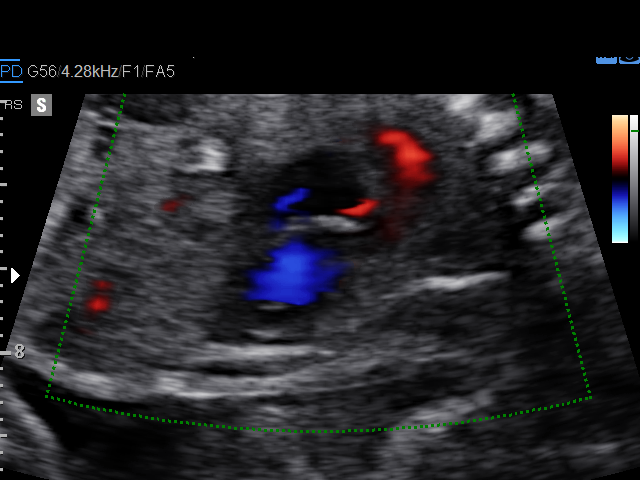
[im 122/122]
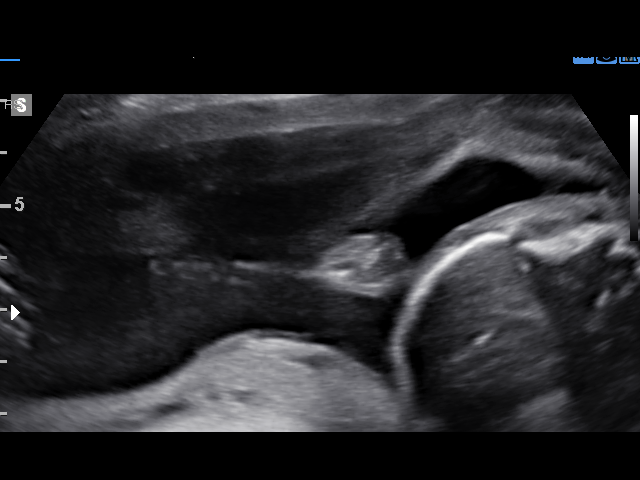

[14 of 28 positions shown; findings below may reference images not displayed]

Obstetrics &
Gynecology
[address]
[place].

1  JESSIE HOPPER            [id number]       [id number]     [id number]
Indications

23 weeks gestation of pregnancy
Detailed fetal anatomic survey                 Z36
Fetal abnormality - other known or
suspected (? dolichocephaly, ? enlarged CM)
OB History

Gravidity:    1
Fetal Evaluation

Num Of Fetuses:     1
Fetal Heart         149
Rate(bpm):
Cardiac Activity:   Observed
Presentation:       Cephalic
Placenta:           Posterior, above cervical os
P. Cord Insertion:  Visualized, central

Amniotic Fluid
AFI FV:      Subjectively within normal limits

Largest Pocket(cm)
4.91
Biometry

BPD:      56.4  mm     G. Age:  23w 2d         48  %    CI:         69.58  %    70 - 86
FL/HC:       18.9  %    19.2 -
HC:      215.8  mm     G. Age:  23w 4d         54  %    HC/AC:       1.21       1.05 -
AC:      178.5  mm     G. Age:  22w 5d         29  %    FL/BPD:      72.2  %    71 - 87
FL:       40.7  mm     G. Age:  23w 1d         40  %    FL/AC:       22.8  %    20 - 24
HUM:      37.7  mm     G. Age:  23w 2d         45  %
CER:      25.3  mm     G. Age:  23w 2d         52  %
CM:        4.7  mm

Est. FW:     554   gm     1 lb 4 oz     51  %
Gestational Age

LMP:           23w 1d        Date:  12/08/15                 EDD:    09/13/16
U/S Today:     23w 1d                                        EDD:    09/13/16
Best:          23w 1d     Det. By:  LMP  (12/08/15)          EDD:    09/13/16
Anatomy

Cranium:               Appears normal         Aortic Arch:            Appears normal
Cavum:                 Appears normal         Ductal Arch:            Appears normal
Ventricles:            Appears normal         Diaphragm:              Appears normal
Choroid Plexus:        Appears normal         Stomach:                Appears normal, left
sided
Cerebellum:            Appears normal         Abdomen:                Appears normal
Posterior Fossa:       Appears normal         Abdominal Wall:         Appears nml (cord
insert, abd wall)
Nuchal Fold:           Not applicable (>20    Cord Vessels:           Appears normal (3
wks GA)                                        vessel cord)
Face:                  Orbits nl; profile not Kidneys:                Appear normal
well visualized
Lips:                  Appears normal         Bladder:                Appears normal
Thoracic:              Appears normal         Spine:                  Appears normal
Heart:                 Appears normal         Upper Extremities:      Appears normal
(4CH, axis, and
situs)
RVOT:                  Appears normal         Lower Extremities:      Appears normal
LVOT:                  Appears normal

Other:  Fetus appears to be a female. Heels visualized. LUE not well
visualized.
Cervix Uterus Adnexa

Cervix
Length:           3.36  cm.
Normal appearance by transabdominal scan.

Uterus
No abnormality visualized.

Left Ovary
Not visualized.

Right Ovary
Not visualized.

Adnexa:       No abnormality visualized. No adnexal mass
visualized.
Impression

SIUP at 23+1 weeks
Normal detailed fetal anatomy; limited views of profile;
intracranial structures seen and appeared normal
Normal amniotic fluid volume
Measurements consistent with LMP; EFW at the 51st %tile
Recommendations

Follow-up ultrasounds as clinically indicated.

## 2018-08-07 LAB — OB RESULTS CONSOLE ANTIBODY SCREEN: Antibody Screen: NEGATIVE

## 2018-08-07 LAB — OB RESULTS CONSOLE GC/CHLAMYDIA
Chlamydia: NEGATIVE
GC PROBE AMP, GENITAL: NEGATIVE

## 2018-08-07 LAB — OB RESULTS CONSOLE GBS: STREP GROUP B AG: POSITIVE

## 2018-08-07 LAB — OB RESULTS CONSOLE ABO/RH: RH TYPE: POSITIVE

## 2018-08-07 LAB — OB RESULTS CONSOLE RPR: RPR: NONREACTIVE

## 2018-08-07 LAB — OB RESULTS CONSOLE HEPATITIS B SURFACE ANTIGEN: Hepatitis B Surface Ag: NEGATIVE

## 2018-08-07 LAB — OB RESULTS CONSOLE RUBELLA ANTIBODY, IGM: Rubella: IMMUNE

## 2018-08-07 LAB — OB RESULTS CONSOLE HIV ANTIBODY (ROUTINE TESTING): HIV: NONREACTIVE

## 2019-02-26 ENCOUNTER — Encounter (HOSPITAL_COMMUNITY): Payer: Self-pay | Admitting: *Deleted

## 2019-02-26 ENCOUNTER — Telehealth (HOSPITAL_COMMUNITY): Payer: Self-pay | Admitting: *Deleted

## 2019-02-26 NOTE — Telephone Encounter (Signed)
Preadmission screen  

## 2019-02-27 ENCOUNTER — Inpatient Hospital Stay (HOSPITAL_COMMUNITY)
Admission: AD | Admit: 2019-02-27 | Discharge: 2019-02-28 | DRG: 807 | Disposition: A | Discharge status: Home / Self Care | Payer: Managed Care, Other (non HMO) | Attending: Obstetrics and Gynecology | Admitting: Obstetrics and Gynecology

## 2019-02-27 ENCOUNTER — Encounter (HOSPITAL_COMMUNITY): Payer: Self-pay | Admitting: *Deleted

## 2019-02-27 DIAGNOSIS — O99824 Streptococcus B carrier state complicating childbirth: Secondary | ICD-10-CM | POA: Diagnosis present

## 2019-02-27 DIAGNOSIS — Z3490 Encounter for supervision of normal pregnancy, unspecified, unspecified trimester: Secondary | ICD-10-CM

## 2019-02-27 DIAGNOSIS — Z3A38 38 weeks gestation of pregnancy: Secondary | ICD-10-CM | POA: Diagnosis not present

## 2019-02-27 DIAGNOSIS — O26893 Other specified pregnancy related conditions, third trimester: Secondary | ICD-10-CM | POA: Diagnosis present

## 2019-02-27 HISTORY — DX: Encounter for supervision of normal pregnancy, unspecified, unspecified trimester: Z34.90

## 2019-02-27 LAB — CBC
HCT: 38.1 % (ref 36.0–46.0)
Hemoglobin: 12.4 g/dL (ref 12.0–15.0)
MCH: 27.6 pg (ref 26.0–34.0)
MCHC: 32.5 g/dL (ref 30.0–36.0)
MCV: 84.9 fL (ref 80.0–100.0)
Platelets: 313 10*3/uL (ref 150–400)
RBC: 4.49 MIL/uL (ref 3.87–5.11)
RDW: 13.9 % (ref 11.5–15.5)
WBC: 17.9 10*3/uL — ABNORMAL HIGH (ref 4.0–10.5)
nRBC: 0 % (ref 0.0–0.2)

## 2019-02-27 MED ORDER — LIDOCAINE HCL (PF) 1 % IJ SOLN: 30.0000 mL | INTRAMUSCULAR | Status: DC | PRN

## 2019-02-27 MED ORDER — ONDANSETRON HCL 4 MG/2ML IJ SOLN
4.0000 mg | Freq: Four times a day (QID) | INTRAMUSCULAR | Status: DC | PRN
  Administered 2019-02-28: 4 mg via INTRAVENOUS
  Filled 2019-02-27: qty 2

## 2019-02-27 MED ORDER — LACTATED RINGERS IV SOLN
INTRAVENOUS | Status: DC
  Administered 2019-02-27: 23:00:00 via INTRAVENOUS

## 2019-02-27 MED ORDER — SODIUM CHLORIDE 0.9 % IV SOLN
5.0000 10*6.[IU] | Freq: Once | INTRAVENOUS | Status: AC
  Administered 2019-02-28: 5 10*6.[IU] via INTRAVENOUS
  Filled 2019-02-27: qty 5

## 2019-02-27 MED ORDER — PHENYLEPHRINE 40 MCG/ML (10ML) SYRINGE FOR IV PUSH (FOR BLOOD PRESSURE SUPPORT)
80.0000 ug | PREFILLED_SYRINGE | INTRAVENOUS | Status: DC | PRN
  Administered 2019-02-28: 80 ug via INTRAVENOUS

## 2019-02-27 MED ORDER — LACTATED RINGERS IV SOLN: 500.0000 mL | INTRAVENOUS | Status: DC | PRN

## 2019-02-27 MED ORDER — EPHEDRINE 5 MG/ML INJ: 10.0000 mg | INTRAVENOUS | Status: DC | PRN

## 2019-02-27 MED ORDER — OXYTOCIN 40 UNITS IN NORMAL SALINE INFUSION - SIMPLE MED
2.5000 [IU]/h | INTRAVENOUS | Status: DC
  Administered 2019-02-28: 2.5 [IU]/h via INTRAVENOUS
  Filled 2019-02-27: qty 1000

## 2019-02-27 MED ORDER — FLEET ENEMA 7-19 GM/118ML RE ENEM: 1.0000 | ENEMA | RECTAL | Status: DC | PRN

## 2019-02-27 MED ORDER — PHENYLEPHRINE 40 MCG/ML (10ML) SYRINGE FOR IV PUSH (FOR BLOOD PRESSURE SUPPORT)
80.0000 ug | PREFILLED_SYRINGE | INTRAVENOUS | Status: DC | PRN
  Filled 2019-02-27: qty 10

## 2019-02-27 MED ORDER — SOD CITRATE-CITRIC ACID 500-334 MG/5ML PO SOLN: 30.0000 mL | ORAL | Status: DC | PRN

## 2019-02-27 MED ORDER — DIPHENHYDRAMINE HCL 50 MG/ML IJ SOLN: 12.5000 mg | INTRAMUSCULAR | Status: DC | PRN

## 2019-02-27 MED ORDER — OXYTOCIN BOLUS FROM INFUSION
500.0000 mL | Freq: Once | INTRAVENOUS | Status: DC
  Administered 2019-02-28: 500 mL via INTRAVENOUS

## 2019-02-27 MED ORDER — SERTRALINE HCL 50 MG PO TABS: 50.0000 mg | ORAL_TABLET | Freq: Every day | ORAL | Status: DC

## 2019-02-27 MED ORDER — BUTORPHANOL TARTRATE 1 MG/ML IJ SOLN: 1.0000 mg | INTRAMUSCULAR | Status: DC | PRN

## 2019-02-27 MED ORDER — PENICILLIN G 3 MILLION UNITS IVPB - SIMPLE MED: 3.0000 10*6.[IU] | INTRAVENOUS | Status: DC

## 2019-02-27 MED ORDER — FENTANYL-BUPIVACAINE-NACL 0.5-0.125-0.9 MG/250ML-% EP SOLN
12.0000 mL/h | EPIDURAL | Status: DC | PRN
  Filled 2019-02-27: qty 250

## 2019-02-27 MED ORDER — ACETAMINOPHEN 325 MG PO TABS: 650.0000 mg | ORAL_TABLET | ORAL | Status: DC | PRN

## 2019-02-27 MED ORDER — OXYCODONE-ACETAMINOPHEN 5-325 MG PO TABS: 2.0000 | ORAL_TABLET | ORAL | Status: DC | PRN

## 2019-02-27 MED ORDER — LACTATED RINGERS IV SOLN: 500.0000 mL | Freq: Once | INTRAVENOUS | Status: DC

## 2019-02-27 MED ORDER — HYDROXYZINE HCL 50 MG PO TABS: 50.0000 mg | ORAL_TABLET | Freq: Four times a day (QID) | ORAL | Status: DC | PRN

## 2019-02-27 MED ORDER — OXYCODONE-ACETAMINOPHEN 5-325 MG PO TABS: 1.0000 | ORAL_TABLET | ORAL | Status: DC | PRN

## 2019-02-27 NOTE — MAU Note (Signed)
PT SAYS  STRONG UC SINCE 530PM.   DENIES HSV AND MRSA. GBS- UNSURE.   IS A SURROGATE .  LAST WEEK IN OFFICE - DR GREWAL  3 CM.

## 2019-02-28 ENCOUNTER — Inpatient Hospital Stay (HOSPITAL_COMMUNITY): Payer: Managed Care, Other (non HMO) | Admitting: Anesthesiology

## 2019-02-28 ENCOUNTER — Encounter (HOSPITAL_COMMUNITY): Payer: Self-pay | Admitting: *Deleted

## 2019-02-28 LAB — ABO/RH: ABO/RH(D): O POS

## 2019-02-28 LAB — RPR: RPR Ser Ql: NONREACTIVE

## 2019-02-28 LAB — CBC
HCT: 34.4 % — ABNORMAL LOW (ref 36.0–46.0)
Hemoglobin: 11.7 g/dL — ABNORMAL LOW (ref 12.0–15.0)
MCH: 28.5 pg (ref 26.0–34.0)
MCHC: 34 g/dL (ref 30.0–36.0)
MCV: 83.7 fL (ref 80.0–100.0)
Platelets: 259 10*3/uL (ref 150–400)
RBC: 4.11 MIL/uL (ref 3.87–5.11)
RDW: 14 % (ref 11.5–15.5)
WBC: 25.2 10*3/uL — ABNORMAL HIGH (ref 4.0–10.5)
nRBC: 0 % (ref 0.0–0.2)

## 2019-02-28 LAB — TYPE AND SCREEN
ABO/RH(D): O POS
ANTIBODY SCREEN: NEGATIVE

## 2019-02-28 MED ORDER — BENZOCAINE-MENTHOL 20-0.5 % EX AERO: 1.0000 "application " | INHALATION_SPRAY | CUTANEOUS | Status: DC | PRN

## 2019-02-28 MED ORDER — COCONUT OIL OIL: 1.0000 "application " | TOPICAL_OIL | Status: DC | PRN

## 2019-02-28 MED ORDER — DIBUCAINE 1 % RE OINT: 1.0000 "application " | TOPICAL_OINTMENT | RECTAL | Status: DC | PRN

## 2019-02-28 MED ORDER — OXYCODONE HCL 5 MG PO TABS: 5.0000 mg | ORAL_TABLET | ORAL | Status: DC | PRN

## 2019-02-28 MED ORDER — SIMETHICONE 80 MG PO CHEW: 80.0000 mg | CHEWABLE_TABLET | ORAL | Status: DC | PRN

## 2019-02-28 MED ORDER — ONDANSETRON HCL 4 MG/2ML IJ SOLN: 4.0000 mg | INTRAMUSCULAR | Status: DC | PRN

## 2019-02-28 MED ORDER — ONDANSETRON HCL 4 MG PO TABS: 4.0000 mg | ORAL_TABLET | ORAL | Status: DC | PRN

## 2019-02-28 MED ORDER — DIPHENHYDRAMINE HCL 25 MG PO CAPS: 25.0000 mg | ORAL_CAPSULE | Freq: Four times a day (QID) | ORAL | Status: DC | PRN

## 2019-02-28 MED ORDER — IBUPROFEN 600 MG PO TABS
600.0000 mg | ORAL_TABLET | Freq: Four times a day (QID) | ORAL | Status: DC
  Administered 2019-02-28 (×2): 600 mg via ORAL
  Filled 2019-02-28 (×2): qty 1

## 2019-02-28 MED ORDER — TETANUS-DIPHTH-ACELL PERTUSSIS 5-2.5-18.5 LF-MCG/0.5 IM SUSP: 0.5000 mL | Freq: Once | INTRAMUSCULAR | Status: DC

## 2019-02-28 MED ORDER — LIDOCAINE-EPINEPHRINE (PF) 2 %-1:200000 IJ SOLN
INTRAMUSCULAR | Status: DC | PRN
  Administered 2019-02-28: 2 mL via EPIDURAL
  Administered 2019-02-28: 5 mL via EPIDURAL

## 2019-02-28 MED ORDER — ZOLPIDEM TARTRATE 5 MG PO TABS: 5.0000 mg | ORAL_TABLET | Freq: Every evening | ORAL | Status: DC | PRN

## 2019-02-28 MED ORDER — ACETAMINOPHEN 325 MG PO TABS: 650.0000 mg | ORAL_TABLET | ORAL | Status: DC | PRN

## 2019-02-28 MED ORDER — OXYCODONE HCL 5 MG PO TABS: 10.0000 mg | ORAL_TABLET | ORAL | Status: DC | PRN

## 2019-02-28 MED ORDER — SENNOSIDES-DOCUSATE SODIUM 8.6-50 MG PO TABS: 2.0000 | ORAL_TABLET | ORAL | Status: DC

## 2019-02-28 MED ORDER — WITCH HAZEL-GLYCERIN EX PADS: 1.0000 "application " | MEDICATED_PAD | CUTANEOUS | Status: DC | PRN

## 2019-02-28 MED ORDER — PRENATAL MULTIVITAMIN CH
1.0000 | ORAL_TABLET | Freq: Every day | ORAL | Status: DC
  Administered 2019-02-28: 1 via ORAL
  Filled 2019-02-28: qty 1

## 2019-02-28 MED ORDER — SODIUM CHLORIDE (PF) 0.9 % IJ SOLN
INTRAMUSCULAR | Status: DC | PRN
  Administered 2019-02-28: 12 mL/h via EPIDURAL

## 2019-02-28 NOTE — Progress Notes (Signed)
Delivery Note At 2:20 AM a viable female was delivered via Vaginal, Spontaneous (Presentation:LOA ;  ).  APGAR: 8, 9; weight  .   Placenta status:intact ,to pathology .  Cord: 3 vessels with the following complications: .  Cord pH: pending  Peds present due to thick meconium. Baby had immediate cry>quick bulb suction done and cord immediately cut and baby handed to neonatologist.  Anesthesia:   Episiotomy: None Lacerations: 2nd degree ML Lac repaired Suture Repair: 2.0 vicryl rapide Est. Blood Loss (mL):    Mom to postpartum.  Baby to Couplet care / Skin to Skin.  Roselle Locus II 02/28/2019, 2:38 AM

## 2019-02-28 NOTE — H&P (Signed)
Emma Spencer is a 28 y.o. female presenting for UCs. Denies ROM, fever. Pregnancy uncomplicated. Patient is a surrogate. OB History    Gravida  2   Para  1   Term  1   Preterm      AB      Living  1     SAB      TAB      Ectopic      Multiple  0   Live Births  1          Past Medical History:  Diagnosis Date  . Anemia    Past Surgical History:  Procedure Laterality Date  . WISDOM TOOTH EXTRACTION     Family History: family history includes Anxiety disorder in her maternal aunt and mother; Depression in her maternal aunt and mother; Diabetes in her father; Heart disease in her paternal grandfather; Hypertension in her father and mother; Multiple sclerosis in her maternal aunt; Rheum arthritis in her maternal aunt; Stomach cancer in her maternal grandmother. Social History:  reports that she has never smoked. She has never used smokeless tobacco. She reports that she does not drink alcohol or use drugs.     Maternal Diabetes: No Genetic Screening: Normal Maternal Ultrasounds/Referrals: Normal Fetal Ultrasounds or other Referrals:  None Maternal Substance Abuse:  No Significant Maternal Medications:  None Significant Maternal Lab Results:  None Other Comments:  None  Review of Systems  Constitutional: Negative for fever.  Eyes: Negative for blurred vision.  Gastrointestinal: Negative for abdominal pain.  Neurological: Negative for headaches.   Maternal Medical History:  Reason for admission: Contractions.   Contractions: Onset was 6-12 hours ago.    Fetal activity: Perceived fetal activity is normal.      Dilation: 8 Effacement (%): 90 Station: -1 Exam by:: Dr Henderson Cloud Blood pressure (!) 99/55, pulse 83, temperature 97.9 F (36.6 C), resp. rate 18, SpO2 100 %, unknown if currently breastfeeding. Maternal Exam:  Uterine Assessment: Contraction strength is firm.  Contraction frequency is regular.   Abdomen: Fetal presentation:  vertex     Fetal Exam Fetal Monitor Review: Pattern: accelerations present and early decelerations.       Physical Exam  Cardiovascular: Normal rate and regular rhythm.  Respiratory: Effort normal and breath sounds normal.  GI: Soft.  Neurological: She has normal reflexes.    8/90/-1/vtx AROM > thick mec  FHT 160's, + accelerations, some early decelerations  Prenatal labs: ABO, Rh: --/--/O POS, O POS Performed at [facility], [address], [place], [place] [address]  [phone]) Antibody: NEG (03/17 2313) Rubella: Immune (08/26 0000) RPR: Nonreactive (08/26 0000)  HBsAg: Negative (08/26 0000)  HIV: Non-reactive (08/26 0000)  GBS: Positive (08/26 0000)   Assessment/Plan: 28 yo G2P1 @ 38 5/7 weeks D/W thick meconium>peds at delivery D/W FHT upper normal rate-afebrile, additional 250cc IV bolus given GBBS+-ATB per protocol started   Roselle Locus II 02/28/2019, 1:26 AM

## 2019-02-28 NOTE — Anesthesia Procedure Notes (Signed)
Epidural Patient location during procedure: OB Start time: 02/28/2019 12:06 AM End time: 02/28/2019 12:23 AM  Staffing Anesthesiologist: Lucretia Kern, MD Performed: anesthesiologist   Preanesthetic Checklist Completed: patient identified, pre-op evaluation, timeout performed, IV checked, risks and benefits discussed and monitors and equipment checked  Epidural Patient position: sitting Prep: DuraPrep Patient monitoring: heart rate, continuous pulse ox and blood pressure Approach: midline Location: L3-L4 Injection technique: LOR air  Needle:  Needle type: Tuohy  Needle gauge: 17 G Needle length: 9 cm Needle insertion depth: 5 cm Catheter type: closed end flexible Catheter size: 19 Gauge Catheter at skin depth: 10 cm Test dose: negative and 2% lidocaine with Epi 1:200 K  Assessment Events: blood not aspirated, injection not painful, no injection resistance, negative IV test and no paresthesia  Additional Notes Reason for block:procedure for pain

## 2019-02-28 NOTE — Anesthesia Preprocedure Evaluation (Signed)
Anesthesia Evaluation  Patient identified by MRN, date of birth, ID band Patient awake    Reviewed: Allergy & Precautions, H&P , NPO status , Patient's Chart, lab work & pertinent test results  History of Anesthesia Complications Negative for: history of anesthetic complications  Airway Mallampati: II  TM Distance: >3 FB Neck ROM: full    Dental no notable dental hx.    Pulmonary neg pulmonary ROS,    Pulmonary exam normal        Cardiovascular negative cardio ROS Normal cardiovascular exam Rhythm:regular Rate:Normal     Neuro/Psych PSYCHIATRIC DISORDERS Anxiety Depression negative neurological ROS     GI/Hepatic negative GI ROS, Neg liver ROS,   Endo/Other  negative endocrine ROS  Renal/GU negative Renal ROS  negative genitourinary   Musculoskeletal   Abdominal   Peds  Hematology negative hematology ROS (+)   Anesthesia Other Findings   Reproductive/Obstetrics (+) Pregnancy                             Anesthesia Physical Anesthesia Plan  ASA: II  Anesthesia Plan: Epidural   Post-op Pain Management:    Induction:   PONV Risk Score and Plan:   Airway Management Planned:   Additional Equipment:   Intra-op Plan:   Post-operative Plan:   Informed Consent: I have reviewed the patients History and Physical, chart, labs and discussed the procedure including the risks, benefits and alternatives for the proposed anesthesia with the patient or authorized representative who has indicated his/her understanding and acceptance.       Plan Discussed with:   Anesthesia Plan Comments:         Anesthesia Quick Evaluation

## 2019-02-28 NOTE — Progress Notes (Signed)
Post Partum Day 0 Subjective: no complaints, up ad lib, voiding and tolerating PO  Objective: Blood pressure 121/63, pulse 67, temperature 98.5 F (36.9 C), temperature source Oral, resp. rate 18, SpO2 99 %, unknown if currently breastfeeding.  Physical Exam:  General: alert, cooperative and appears stated age Lochia: appropriate Uterine Fundus: firm Incision: N/A DVT Evaluation: No evidence of DVT seen on physical exam. Negative Homan's sign. No cords or calf tenderness. No significant calf/ankle edema.  Recent Labs    02/27/19 2313 02/28/19 0524  HGB 12.4 11.7*  HCT 38.1 34.4*    Assessment/Plan: Discharge home Patient strongly desires d/c today. She is meeting all goals thus far and has no RF's for pph or PIH. Not going home with newborn (surrogate) thus, if later today she is still doing well, can allow d/c.    LOS: 1 day   Ranae Pila 02/28/2019, 8:04 AM

## 2019-02-28 NOTE — Discharge Summary (Signed)
Obstetric Discharge Summary Reason for Admission: onset of labor Prenatal Procedures: none Intrapartum Procedures: spontaneous vaginal delivery Postpartum Procedures: none Complications-Operative and Postpartum: 2nd degree perineal laceration Hemoglobin  Date Value Ref Range Status  02/28/2019 11.7 (L) 12.0 - 15.0 g/dL Final   HCT  Date Value Ref Range Status  02/28/2019 34.4 (L) 36.0 - 46.0 % Final    Physical Exam:  General: alert, cooperative and appears stated age 70: appropriate Uterine Fundus: firm Incision: healing well, no significant drainage, no dehiscence, no significant erythema DVT Evaluation: No evidence of DVT seen on physical exam. Negative Homan's sign. No cords or calf tenderness. No significant calf/ankle edema.  Discharge Diagnoses: Term Pregnancy-delivered  Discharge Information: Date: 02/28/2019 Activity: pelvic rest Diet: routine Medications: PNV Condition: stable Instructions: refer to practice specific booklet Discharge to: home   Newborn Data: Live born female  Birth Weight: 8 lb 0.4 oz (3640 g) APGAR: 8, 9  Newborn Delivery   Birth date/time:  02/28/2019 02:20:00 Delivery type:  Vaginal, Spontaneous     Home with the newborn's mother. Patient is a surrogate.Ranae Pila 02/28/2019, 3:17 PM

## 2019-02-28 NOTE — Anesthesia Postprocedure Evaluation (Signed)
Anesthesia Post Note  Patient: Emma Spencer  Procedure(s) Performed: AN AD HOC LABOR EPIDURAL     Patient location during evaluation: Mother Baby Anesthesia Type: Epidural Level of consciousness: awake and alert Pain management: pain level controlled Vital Signs Assessment: post-procedure vital signs reviewed and stable Respiratory status: spontaneous breathing, nonlabored ventilation and respiratory function stable Cardiovascular status: stable Postop Assessment: no headache, no backache, epidural receding, no apparent nausea or vomiting, patient able to bend at knees, able to ambulate and adequate PO intake Anesthetic complications: no    Last Vitals:  Vitals:   02/28/19 0430 02/28/19 0521  BP: 131/69 121/63  Pulse: 72 67  Resp: 18 18  Temp: 37.2 C 36.9 C  SpO2:      Last Pain:  Vitals:   02/28/19 0523  TempSrc:   PainSc: 0-No pain   Pain Goal:                   [employer]

## 2019-03-05 ENCOUNTER — Inpatient Hospital Stay (HOSPITAL_COMMUNITY): Payer: Managed Care, Other (non HMO)

## 2019-03-07 ENCOUNTER — Inpatient Hospital Stay (HOSPITAL_COMMUNITY): Payer: Managed Care, Other (non HMO)

## 2019-11-16 ENCOUNTER — Other Ambulatory Visit: Payer: Self-pay

## 2019-11-16 DIAGNOSIS — Z20822 Contact with and (suspected) exposure to covid-19: Secondary | ICD-10-CM

## 2019-11-18 LAB — NOVEL CORONAVIRUS, NAA: SARS-CoV-2, NAA: NOT DETECTED

## 2020-02-04 ENCOUNTER — Ambulatory Visit: Payer: Self-pay

## 2020-11-29 ENCOUNTER — Other Ambulatory Visit: Payer: Self-pay

## 2020-11-29 ENCOUNTER — Encounter (HOSPITAL_BASED_OUTPATIENT_CLINIC_OR_DEPARTMENT_OTHER): Payer: Self-pay | Admitting: Emergency Medicine

## 2020-11-29 ENCOUNTER — Emergency Department (HOSPITAL_BASED_OUTPATIENT_CLINIC_OR_DEPARTMENT_OTHER)
Admission: EM | Admit: 2020-11-29 | Discharge: 2020-11-29 | Disposition: A | Discharge status: Home / Self Care | Payer: 59 | Attending: Emergency Medicine | Admitting: Emergency Medicine

## 2020-11-29 DIAGNOSIS — W208XXA Other cause of strike by thrown, projected or falling object, initial encounter: Secondary | ICD-10-CM | POA: Insufficient documentation

## 2020-11-29 DIAGNOSIS — S0990XA Unspecified injury of head, initial encounter: Secondary | ICD-10-CM | POA: Insufficient documentation

## 2020-11-29 DIAGNOSIS — Y92512 Supermarket, store or market as the place of occurrence of the external cause: Secondary | ICD-10-CM | POA: Diagnosis not present

## 2020-11-29 NOTE — ED Provider Notes (Signed)
[facility] EMERGENCY DEPARTMENT Provider Note   CSN: [id number] Arrival date & time: 11/29/20  1757     History Chief Complaint  Patient presents with  . Head Injury    Emma Spencer is a 29 y.o. female.  Patient is a 29 year old female who presents after head injury.  She was shopping at a store and an extension ladder slipped over and hit her on the back of her head.  This happened earlier this afternoon.  She had no loss of consciousness.  She has been complaining of some generalized headache and photophobia.  She has no significant nausea.  No vomiting.  No change in her vision.  No difficulty with her balance.  No dizziness.  She is not on anticoagulants.  She does have some minor soreness to her upper back where the latter also hit but says it is pretty minor.  She has not take anything for the pain.  She has no numbness or weakness to her extremities.        Past Medical History:  Diagnosis Date  . Anemia     Patient Active Problem List   Diagnosis Date Noted  . Term pregnancy 02/27/2019  . Second-degree perineal laceration, with delivery 09/15/2016  . H/O: depression (controlled w/ Zoloft) 09/15/2016  . H/O anxiety disorder (controlled w/ Zoloft) 09/15/2016  . Palpitations 04/19/2016    Past Surgical History:  Procedure Laterality Date  . WISDOM TOOTH EXTRACTION       OB History    Gravida  2   Para  2   Term  2   Preterm      AB      Living  2     SAB      IAB      Ectopic      Multiple  0   Live Births  2           Family History  Problem Relation Age of Onset  . Hypertension Mother   . Anxiety disorder Mother   . Depression Mother   . Hypertension Father   . Diabetes Father   . Rheum arthritis Maternal Aunt   . Multiple sclerosis Maternal Aunt   . Anxiety disorder Maternal Aunt   . Depression Maternal Aunt   . Stomach cancer Maternal Grandmother   . Heart disease Paternal Grandfather     Social History    Tobacco Use  . Smoking status: Never Smoker  . Smokeless tobacco: Never Used  Substance Use Topics  . Alcohol use: No    Alcohol/week: 0.0 standard drinks  . Drug use: No    Home Medications Prior to Admission medications   Medication Sig Start Date End Date Taking? Authorizing Provider  docusate sodium (COLACE) 100 MG capsule Take 100 mg by mouth daily.    [provider]  ibuprofen (ADVIL,MOTRIN) 600 MG tablet Take 1 tablet (600 mg total) by mouth every 6 (six) hours. 09/17/16   Clemmons, Lori A, CNM  Prenat w/o A-[id number] (CITRANATAL ASSURE) 35-1 & 300 MG tablet Take 2 tablets by mouth daily.    [provider]  sertraline (ZOLOFT) 50 MG tablet Take 50 mg by mouth daily. 07/28/16   [provider]    Allergies    Patient has no known allergies.  Review of Systems   Review of Systems  Constitutional: Negative for chills, diaphoresis, fatigue and fever.  HENT: Negative for congestion, rhinorrhea and sneezing.   Eyes: Positive for photophobia.  Respiratory:  Negative for cough, chest tightness and shortness of breath.   Cardiovascular: Negative for chest pain and leg swelling.  Gastrointestinal: Negative for abdominal pain, blood in stool, diarrhea, nausea and vomiting.  Genitourinary: Negative for difficulty urinating, flank pain, frequency and hematuria.  Musculoskeletal: Positive for myalgias. Negative for arthralgias and back pain.  Skin: Negative for rash.  Neurological: Positive for headaches. Negative for dizziness, speech difficulty, weakness and numbness.    Physical Exam Updated Vital Signs BP 128/64 (BP Location: Left Arm)   Pulse 66   Temp 98.2 F (36.8 C) (Oral)   Resp 18   Ht 5\' 6"  (1.676 m)   Wt 90.7 kg   LMP 11/23/2020   SpO2 100%   BMI 32.28 kg/m   Physical Exam Constitutional:      Appearance: She is well-developed and well-nourished.  HENT:     Head: Normocephalic and atraumatic.     Comments: No wounds or  hematomas are noted Eyes:     Pupils: Pupils are equal, round, and reactive to light.  Neck:     Comments: No pain to the cervical, thoracic or lumbosacral spine.  There are some very minimal tenderness to her right upper back in the musculature.  No wounds are noted. Cardiovascular:     Rate and Rhythm: Normal rate and regular rhythm.     Heart sounds: Normal heart sounds.  Pulmonary:     Effort: Pulmonary effort is normal. No respiratory distress.     Breath sounds: Normal breath sounds. No wheezing or rales.  Chest:     Chest wall: No tenderness.  Abdominal:     General: Bowel sounds are normal.     Palpations: Abdomen is soft.     Tenderness: There is no abdominal tenderness. There is no guarding or rebound.  Musculoskeletal:        General: No edema. Normal range of motion.     Cervical back: Normal range of motion and neck supple.  Lymphadenopathy:     Cervical: No cervical adenopathy.  Skin:    General: Skin is warm and dry.     Findings: No rash.  Neurological:     General: No focal deficit present.     Mental Status: She is alert and oriented to person, place, and time.     Comments: Motor 5/5 all extremities Sensation grossly intact to LT all extremities Finger to Nose intact, no pronator drift CN II-XII grossly intact Gait normal   Psychiatric:        Mood and Affect: Mood and affect normal.     ED Results / Procedures / Treatments   Labs (all labs ordered are listed, but only abnormal results are displayed) Labs Reviewed - No data to display  EKG None  Radiology No results found.  Procedures Procedures (including critical care time)  Medications Ordered in ED Medications - No data to display  ED Course  I have reviewed the triage vital signs and the nursing notes.  Pertinent labs & imaging results that were available during my care of the patient were reviewed by me and considered in my medical decision making (see chart for details).    MDM  Rules/Calculators/A&P                          Patient presents after head injury.  She has mild concussion symptoms.  No symptoms that warrant imaging.  She was given head injury precautions and advised to follow-up  with her PCP next week for recheck.  Return precautions were given. Final Clinical Impression(s) / ED Diagnoses Final diagnoses:  Injury of head, initial encounter    Rx / DC Orders ED Discharge Orders    None       Rolan Bucco, MD 11/29/20 2045

## 2020-11-29 NOTE — ED Triage Notes (Signed)
States a ladder fell on her head while shopping today. C/o headache and upper back pain.

## 2021-04-16 LAB — HM PAP SMEAR: HPV, high-risk: NEGATIVE

## 2021-11-04 ENCOUNTER — Other Ambulatory Visit: Payer: Self-pay | Admitting: Internal Medicine

## 2021-11-04 ENCOUNTER — Ambulatory Visit
Admission: RE | Admit: 2021-11-04 | Discharge: 2021-11-04 | Disposition: A | Discharge status: Home / Self Care | Payer: 59 | Source: Ambulatory Visit | Attending: Internal Medicine | Admitting: Internal Medicine

## 2021-11-04 DIAGNOSIS — M25512 Pain in left shoulder: Secondary | ICD-10-CM

## 2021-11-11 ENCOUNTER — Other Ambulatory Visit: Payer: Self-pay

## 2021-11-11 ENCOUNTER — Ambulatory Visit: Payer: 59 | Attending: Internal Medicine | Admitting: Occupational Therapy

## 2021-11-11 DIAGNOSIS — M25512 Pain in left shoulder: Secondary | ICD-10-CM | POA: Diagnosis present

## 2021-11-11 DIAGNOSIS — M6281 Muscle weakness (generalized): Secondary | ICD-10-CM | POA: Insufficient documentation

## 2021-11-11 DIAGNOSIS — G8929 Other chronic pain: Secondary | ICD-10-CM | POA: Insufficient documentation

## 2021-11-11 DIAGNOSIS — R208 Other disturbances of skin sensation: Secondary | ICD-10-CM | POA: Diagnosis present

## 2021-11-11 NOTE — Therapy (Signed)
[facility] [address] [place], [place], [address] Phone: [phone]   Fax:  [phone]  Occupational Therapy Evaluation  Patient Details  Name: Emma Spencer MRN: [id number] Date of Birth: [birth year] Referring Provider (OT): Dr. Orson Aloe   Encounter Date: 11/11/2021   OT End of Session - 11/11/21 1159     Visit Number 1    Number of Visits 17    Date for OT Re-Evaluation 01/13/22    Authorization Type UHC    OT Start Time 0721    OT Stop Time 0800    OT Time Calculation (min) 39 min             Past Medical History:  Diagnosis Date   Anemia     Past Surgical History:  Procedure Laterality Date   WISDOM TOOTH EXTRACTION      There were no vitals filed for this visit.   Subjective Assessment - 11/11/21 0726     Subjective  Pt reports left shoulder pain for last 3 weeks    Patient Stated Goals decrease shoulder pain    Currently in Pain? Yes    Pain Score 4     Pain Location Shoulder    Pain Orientation Left    Pain Descriptors / Indicators Aching    Pain Type Chronic pain    Pain Onset More than a month ago    Pain Frequency Intermittent               [facility] OT Assessment - 11/11/21 0728       Assessment   Medical Diagnosis left shoulder pain (symptoms consistent with cervical radiculopathy)    Referring Provider (OT) Dr. Orson Aloe    Onset Date/Surgical Date 11/03/21    Hand Dominance Right    Prior Therapy no      Home  Environment   Family/patient expects to be discharged to: Private residence    Living Arrangements Children    Lives With Spouse      Prior Function   Vocation Full time employment    Barista, college professor      ADL   Eating/Feeding Modified independent    Grooming Modified independent    Upper Body Bathing Modified independent    Lower Body Bathing Modified independent    Upper Body Dressing Independent   Difficulty  donning a coat, pullover shirt   Lower Body Dressing Modified independent    Toilet Transfer Modified independent    Tub/Shower Transfer Modified independent    ADL comments uses LUE as non dominant assist only grossly 30% of the time, Pt reports difficulty carrying items      IADL   Shopping Takes care of all shopping needs independently   uses RUE   Light Housekeeping Performs light daily tasks such as dishwashing, bed making   uses primarily RUE   Meal Prep Able to complete simple warm meal prep      Mobility   Mobility Status Independent      Written Expression   Dominant Hand Right      Cognition   Overall Cognitive Status Within Functional Limits for tasks assessed      Sensation   Light Touch Impaired by gross assessment   mildly impaired fingers and hand of LUE   Hot/Cold Appears Intact      Coordination   Fine Motor Movements are Fluid and Coordinated Yes    9 Hole Peg Test Right;Left    Right  9 Hole Peg Test 19.88    Left 9 Hole Peg Test 21.46      ROM / Strength   AROM / PROM / Strength AROM;Strength      AROM   Overall AROM  Deficits    Overall AROM Comments RUE A/ROM and strength WFLS, LUE shoulder flexion 115* prior to significant pain, shoulder abdduction 75* prior to significant pain , tightness present in trapezius      Strength   Overall Strength Deficits    Overall Strength Comments RUE WFLS, LUE biceps and triceps 4+/5, shoulder limited by pain, pt unable to tolerate resistance      Hand Function   Right Hand Grip (lbs) 65.9    Left Hand Grip (lbs) 47.6                    Treatment: supine on mat: hotpack to left shoulder x 8 mins with pt performing A/ROM scapular and shoulder retraction, gentle mobs to left shoulder and traps, no adverse reactions            OT Short Term Goals - 11/11/21 1206       OT SHORT TERM GOAL #1   Title I with inital HEP    Time 4    Period Weeks    Status New    Target Date 12/09/21      OT  SHORT TERM GOAL #2   Title Pt will verbalize understanding of positioning to minimize pain    Time 4    Period Weeks    Status New      OT SHORT TERM GOAL #3   Title Pt will be I with activity modification/ adapted strategies for ADLs/ IADLS to minimize pain(ie: pullover shirt, donning jacket, carrying items)    Time 4    Period Weeks    Status New               OT Long Term Goals - 11/11/21 1209       OT LONG TERM GOAL #1   Title I with updated HEP    Time 8    Period Weeks    Status New      OT LONG TERM GOAL #2   Title Pt will retrieve a lightweight object from overhead shelf at 120 shoulder flexion with LUE with pain no greater than 4/10.    Time 8    Period Weeks    Status New      OT LONG TERM GOAL #3   Title Pt will increase LUE grip strength by 5 lbs for increased ease with ADLs/ IADLs.    Time 8    Period Weeks    Status New      OT LONG TERM GOAL #4   Title Pt will demonstrate 90* shoulder abduction with pain no greater than 4/10.    Time 8    Period Weeks    Status New      OT LONG TERM GOAL #5   Title Pt will use LUE as a non dominant assist for ADLs at least 75% of the time with pain no greater than 4/10    Time 8    Period Weeks    Status New                   Plan - 11/11/21 1159     Clinical Impression Statement Pt is a 30 y.o female referred for left shoulder pain with symptoms consistent  with cervical radiculopathy. PMH: anxiety, depression, hx of concussion. Pt presents with the following deficits: shoulder pain, LUE weakness, sensory deficits which impedes perfromance of daily activities.Pt can benefit from skilled occupational therapy to address these deficits in order to maximize safety and I with ADLs/ IADLs.    OT Occupational Profile and History Problem Focused Assessment - Including review of records relating to presenting problem    Occupational performance deficits (Please refer to evaluation for details):  ADL's;IADL's;Play;Leisure;Social Participation;Work    Games developer / Function / Physical Skills ADL;UE functional use;Muscle spasms;Pain;Flexibility;ROM;Sensation;Decreased knowledge of precautions;Decreased knowledge of use of DME;IADL;Strength    Rehab Potential Good    Clinical Decision Making Limited treatment options, no task modification necessary    Comorbidities Affecting Occupational Performance: May have comorbidities impacting occupational performance    Modification or Assistance to Complete Evaluation  No modification of tasks or assist necessary to complete eval    OT Frequency 2x / week    OT Duration 8 weeks   plus eval or 17 visits   OT Treatment/Interventions Self-care/ADL training;Ultrasound;Energy conservation;Patient/family education;DME and/or AE instruction;Aquatic Therapy;Paraffin;Passive range of motion;Cryotherapy;Fluidtherapy;Electrical Stimulation;Splinting;Therapeutic activities;Manual Therapy;Therapeutic exercise;Moist Heat;Neuromuscular education    Plan gentle stretches for home, ? heat/ [place] on shoulder, consider kinesiotape (check to make sure no precautions that would prevent [place]    Consulted and Agree with Plan of Care Patient             Patient will benefit from skilled therapeutic intervention in order to improve the following deficits and impairments:   Body Structure / Function / Physical Skills: ADL, UE functional use, Muscle spasms, Pain, Flexibility, ROM, Sensation, Decreased knowledge of precautions, Decreased knowledge of use of DME, IADL, Strength       Visit Diagnosis: Chronic left shoulder pain - Plan: Ot plan of care cert/re-cert  Muscle weakness (generalized) - Plan: Ot plan of care cert/re-cert  Other disturbances of skin sensation - Plan: Ot plan of care cert/re-cert    Problem List Patient Active Problem List   Diagnosis Date Noted   Term pregnancy 02/27/2019   Second-degree perineal laceration, with delivery 09/15/2016    H/O: depression (controlled w/ Zoloft) 09/15/2016   H/O anxiety disorder (controlled w/ Zoloft) 09/15/2016   Palpitations 04/19/2016    Roper Tolson, OT/L 11/11/2021, 1:32 PM Keene Breath, OTR/L [phone] Phone: [phone] 1:32 PM 11/11/21  [facility] [address] [place], [place], [address] Phone: [phone]   Fax:  [phone]  Name: Emma Spencer MRN: [id number] Date of Birth: 30-Aug-1991

## 2021-11-12 ENCOUNTER — Encounter: Payer: Self-pay | Admitting: Occupational Therapy

## 2021-11-12 ENCOUNTER — Ambulatory Visit: Payer: 59 | Attending: Internal Medicine | Admitting: Occupational Therapy

## 2021-11-12 DIAGNOSIS — G8929 Other chronic pain: Secondary | ICD-10-CM | POA: Diagnosis present

## 2021-11-12 DIAGNOSIS — M25512 Pain in left shoulder: Secondary | ICD-10-CM | POA: Insufficient documentation

## 2021-11-12 DIAGNOSIS — M6281 Muscle weakness (generalized): Secondary | ICD-10-CM | POA: Diagnosis present

## 2021-11-12 DIAGNOSIS — R208 Other disturbances of skin sensation: Secondary | ICD-10-CM | POA: Insufficient documentation

## 2021-11-12 NOTE — Patient Instructions (Addendum)
  Upper Extremity: [employer] down.  Hold a ball/shoe box/pillow (shoulder width sized)  with arms straight and hands flat on either side. Start with ball on chest, then straighten elbows to push ball up to the ceiling trying to keep elbows next to body hold 3sec, then slowly lower by keeping elbows close to your side. Keep shoulders away from ears.  Move in pain  Repeat 10 times.  2x/day   Overhead Arm Extension - Supine     Lay down.  Hold a ball/shoe box/pillow (shoulder width sized) on tops of legs with arms straight and hands flat on either side. Slowly move arms up/back in an arc with elbows straight and then slowly lower back down. Keep shoulders away from ears. Repeat 10 times.     2x/day

## 2021-11-12 NOTE — Therapy (Signed)
[facility] [address] [place], [place], [address] Phone: [phone]   Fax:  [phone]  Occupational Therapy Treatment  Patient Details  Name: Emma Spencer MRN: [id number] Date of Birth: March 20, 1991 Referring Provider (OT): Dr. Orson Aloe   Encounter Date: 11/12/2021   OT End of Session - 11/12/21 0900     Visit Number 2    Number of Visits 17    Date for OT Re-Evaluation 01/13/22    Authorization Type UHC    OT Start Time 0857   arrived late   OT Stop Time 0930    OT Time Calculation (min) 33 min             Past Medical History:  Diagnosis Date   Anemia     Past Surgical History:  Procedure Laterality Date   WISDOM TOOTH EXTRACTION      There were no vitals filed for this visit.   Subjective Assessment - 11/12/21 0858     Subjective  Pt reports pain has been over last 10 years off/on, but that "this time" it is worse    Patient Stated Goals decrease shoulder pain    Currently in Pain? Yes    Pain Score 4     Pain Location Shoulder    Pain Orientation Left    Pain Descriptors / Indicators Sharp   pulling   Pain Type Chronic pain    Pain Onset More than a month ago    Pain Frequency Intermittent    Aggravating Factors  moving it    Pain Relieving Factors not moving it, heat               Ultrasound x8 min to L upper traps/shoulder , 1.0 wts/cm2, continuous with no adverse reactions.  Kinesiotape applied to L upper traps to relax.     Wall slides within pain free range for incr scapular mobility with min cueing.  Pt instructed to try to stay in pain-free range with HEP and functional activities.  Pt instructed to place LUE in first when donning jacket/sweater and remove last with doffing.    In supine, pt reports incr pain with scapula retraction so discontinued          OT Education - 11/12/21 1037     Education Details Initial HEP (see pt instructions).  Kinesiotape wear/precautions.    Person(s) Educated Patient    Methods Explanation;Demonstration;Handout;Verbal cues    Comprehension Verbalized understanding;Returned demonstration;Verbal cues required              OT Short Term Goals - 11/11/21 1206       OT SHORT TERM GOAL #1   Title I with inital HEP    Time 4    Period Weeks    Status New    Target Date 12/09/21      OT SHORT TERM GOAL #2   Title Pt will verbalize understanding of positioning to minimize pain    Time 4    Period Weeks    Status New      OT SHORT TERM GOAL #3   Title Pt will be I with activity modification/ adapted strategies for ADLs/ IADLS to minimize pain(ie: pullover shirt, donning jacket, carrying items)    Time 4    Period Weeks    Status New               OT Long Term Goals - 11/11/21 1209       OT LONG  TERM GOAL #1   Title I with updated HEP    Time 8    Period Weeks    Status New      OT LONG TERM GOAL #2   Title Pt will retrieve a lightweight object from overhead shelf at 120 shoulder flexion with LUE with pain no greater than 4/10.    Time 8    Period Weeks    Status New      OT LONG TERM GOAL #3   Title Pt will increase LUE grip strength by 5 lbs for increased ease with ADLs/ IADLs.    Time 8    Period Weeks    Status New      OT LONG TERM GOAL #4   Title Pt will demonstrate 90* shoulder abduction with pain no greater than 4/10.    Time 8    Period Weeks    Status New      OT LONG TERM GOAL #5   Title Pt will use LUE as a non dominant assist for ADLs at least 75% of the time with pain no greater than 4/10    Time 8    Period Weeks    Status New                   Plan - 11/12/21 0900     Clinical Impression Statement Pt with swelling in neck/upper trap area.  Pt tolerated ultrasound and initial HEP well.    OT Occupational Profile and History Problem Focused Assessment - Including review of records relating to presenting problem    Occupational  performance deficits (Please refer to evaluation for details): ADL's;IADL's;Play;Leisure;Social Participation;Work    Games developer / Function / Physical Skills ADL;UE functional use;Muscle spasms;Pain;Flexibility;ROM;Sensation;Decreased knowledge of precautions;Decreased knowledge of use of DME;IADL;Strength    Rehab Potential Good    Clinical Decision Making Limited treatment options, no task modification necessary    Comorbidities Affecting Occupational Performance: May have comorbidities impacting occupational performance    Modification or Assistance to Complete Evaluation  No modification of tasks or assist necessary to complete eval    OT Frequency 2x / week    OT Duration 8 weeks   plus eval or 17 visits   OT Treatment/Interventions Self-care/ADL training;Ultrasound;Energy conservation;Patient/family education;DME and/or AE instruction;Aquatic Therapy;Paraffin;Passive range of motion;Cryotherapy;Fluidtherapy;Electrical Stimulation;Splinting;Therapeutic activities;Manual Therapy;Therapeutic exercise;Moist Heat;Neuromuscular education    Plan review gentle stretches for home and add as appropriate, Ultrasound, check on kinesiotape    Consulted and Agree with Plan of Care Patient             Patient will benefit from skilled therapeutic intervention in order to improve the following deficits and impairments:   Body Structure / Function / Physical Skills: ADL, UE functional use, Muscle spasms, Pain, Flexibility, ROM, Sensation, Decreased knowledge of precautions, Decreased knowledge of use of DME, IADL, Strength       Visit Diagnosis: Chronic left shoulder pain  Muscle weakness (generalized)  Other disturbances of skin sensation    Problem List Patient Active Problem List   Diagnosis Date Noted   Term pregnancy 02/27/2019   Second-degree perineal laceration, with delivery 09/15/2016   H/O: depression (controlled w/ Zoloft) 09/15/2016   H/O anxiety disorder (controlled w/  Zoloft) 09/15/2016   Palpitations 04/19/2016    Yale Golla, OT/L 11/12/2021, 10:57 AM  [facility] [address] [place], [place], [address] Phone: [phone]   Fax:  [phone]  Name: Emma Spencer MRN: [id number] Date of  Birth: 1991/04/20  Willa Frater, OTR/L [facility] [address] [place], [place]  [address] [phone] phone [phone] 11/12/21 10:57 AM

## 2021-11-16 ENCOUNTER — Ambulatory Visit: Payer: 59 | Admitting: Occupational Therapy

## 2021-12-02 ENCOUNTER — Ambulatory Visit: Payer: 59 | Admitting: Occupational Therapy

## 2021-12-03 ENCOUNTER — Ambulatory Visit: Payer: 59 | Admitting: Occupational Therapy

## 2021-12-08 ENCOUNTER — Ambulatory Visit: Payer: 59 | Admitting: Occupational Therapy

## 2021-12-10 ENCOUNTER — Ambulatory Visit: Payer: 59 | Admitting: Occupational Therapy

## 2021-12-10 ENCOUNTER — Encounter: Payer: Self-pay | Admitting: Occupational Therapy

## 2021-12-10 NOTE — Therapy (Signed)
[place] [facility] °[address] °[place], [place], [address] °Phone: [phone]   Fax:  [phone] ° °Patient Details  °Name: Emma Spencer °MRN: [id number] °Date of Birth: 04/05/1991 °Referring Provider:  No ref. provider found ° °Encounter Date: 12/10/2021 ° ° °OCCUPATIONAL THERAPY DISCHARGE SUMMARY ° °Visits from Start of Care: 2 (eval + 1 treatment) ° °Current functional level related to goals / functional outcomes: °See eval as pt did not return after 2nd visit.  °  °Remaining deficits: °See eval as pt did not return after 2nd visit °  °Education / Equipment: °Not completed as pt did not return.  Pt instructed in initial HEP, but did not return for review/update.  °Plan: °Patient agrees to discharge.  Patient goals were not met. Patient is being discharged due to not returning since the last visit due to financial concerns.   Pt only seen for eval +1 visit.  ° ° ° ° ° ° °FREEMAN,ANGELA, OT °12/10/2021, 8:42 AM ° °[place] °[facility] °[address] °[place], [place], [address] °Phone: [phone]   Fax:  [phone] ° °Angela Freeman, OTR/L ° [facility] °[address] °, [place]  [address] °[phone] phone °[phone] °12/10/21 8:43 AM ° ° °

## 2021-12-15 ENCOUNTER — Encounter: Payer: 59 | Admitting: Occupational Therapy

## 2021-12-17 ENCOUNTER — Encounter: Payer: 59 | Admitting: Occupational Therapy

## 2021-12-22 ENCOUNTER — Encounter: Payer: 59 | Admitting: Occupational Therapy

## 2021-12-24 ENCOUNTER — Encounter: Payer: 59 | Admitting: Occupational Therapy

## 2022-09-10 ENCOUNTER — Encounter: Payer: Self-pay | Admitting: Gastroenterology

## 2022-09-16 ENCOUNTER — Encounter: Payer: Self-pay | Admitting: Gastroenterology

## 2022-10-26 ENCOUNTER — Ambulatory Visit: Payer: 59 | Admitting: Gastroenterology

## 2022-12-13 NOTE — L&D Delivery Note (Signed)
Delivery Note At 8:24 AM a viable female was delivered via ROA   APGAR: 9 and 9  Placenta status: Spontaneous, Intact.  Cord:   with the following complications:  none.  Cord pH: not obtained  Anesthesia: Epidural Episiotomy:  none Lacerations:  none Suture Repair:  not applicable  Est. Blood Loss (mL):  300  Mom to postpartum.  Baby to Couplet care / Skin to Skin.  Jeani Hawking 11/26/2023, 8:31 AM

## 2023-04-28 LAB — OB RESULTS CONSOLE HEPATITIS B SURFACE ANTIGEN: Hepatitis B Surface Ag: NEGATIVE

## 2023-04-28 LAB — HEPATITIS C ANTIBODY: HCV Ab: NEGATIVE

## 2023-04-28 LAB — OB RESULTS CONSOLE RUBELLA ANTIBODY, IGM: Rubella: IMMUNE

## 2023-04-28 LAB — OB RESULTS CONSOLE HIV ANTIBODY (ROUTINE TESTING): HIV: NONREACTIVE

## 2023-04-28 LAB — OB RESULTS CONSOLE RPR: RPR: NONREACTIVE

## 2023-05-05 LAB — OB RESULTS CONSOLE GC/CHLAMYDIA
Chlamydia: NEGATIVE
Neisseria Gonorrhea: NEGATIVE

## 2023-11-02 LAB — OB RESULTS CONSOLE GBS: GBS: NEGATIVE

## 2023-11-25 ENCOUNTER — Encounter (HOSPITAL_COMMUNITY): Payer: Self-pay | Admitting: *Deleted

## 2023-11-25 ENCOUNTER — Inpatient Hospital Stay (HOSPITAL_COMMUNITY)
Admission: AD | Admit: 2023-11-25 | Discharge: 2023-11-25 | Disposition: A | Discharge status: Home / Self Care | Payer: 59 | Source: Home / Self Care | Attending: Obstetrics and Gynecology | Admitting: Obstetrics and Gynecology

## 2023-11-25 DIAGNOSIS — O471 False labor at or after 37 completed weeks of gestation: Secondary | ICD-10-CM | POA: Insufficient documentation

## 2023-11-25 DIAGNOSIS — Z3A39 39 weeks gestation of pregnancy: Secondary | ICD-10-CM | POA: Insufficient documentation

## 2023-11-25 NOTE — MAU Note (Signed)
.  Emma Spencer is a 32 y.o. at [redacted]w[redacted]d here in MAU reporting: contractions off/on all day, getting closer this pm. Denies ROM. Some pinkish mucous discharge. SVE 2 on Wednesday. Positive fetal movement.  Onset of complaint: today Pain score: 5/10 Vitals:   11/25/23 2108 11/25/23 2109  BP:  139/85  Pulse:  71  Resp: 17   Temp: 98 F (36.7 C)   SpO2: 99%      FHT:144 Lab orders placed from triage:

## 2023-11-26 ENCOUNTER — Inpatient Hospital Stay (HOSPITAL_COMMUNITY)
Admission: AD | Admit: 2023-11-26 | Discharge: 2023-11-27 | DRG: 807 | Disposition: A | Discharge status: Home / Self Care | Payer: 59 | Attending: Obstetrics and Gynecology | Admitting: Obstetrics and Gynecology

## 2023-11-26 ENCOUNTER — Other Ambulatory Visit: Payer: Self-pay

## 2023-11-26 ENCOUNTER — Inpatient Hospital Stay (HOSPITAL_COMMUNITY): Payer: 59 | Admitting: Anesthesiology

## 2023-11-26 ENCOUNTER — Encounter (HOSPITAL_COMMUNITY): Payer: Self-pay | Admitting: Obstetrics and Gynecology

## 2023-11-26 DIAGNOSIS — Z8249 Family history of ischemic heart disease and other diseases of the circulatory system: Secondary | ICD-10-CM | POA: Diagnosis not present

## 2023-11-26 DIAGNOSIS — Z833 Family history of diabetes mellitus: Secondary | ICD-10-CM | POA: Diagnosis not present

## 2023-11-26 DIAGNOSIS — Z3A39 39 weeks gestation of pregnancy: Secondary | ICD-10-CM | POA: Diagnosis not present

## 2023-11-26 DIAGNOSIS — O26893 Other specified pregnancy related conditions, third trimester: Secondary | ICD-10-CM | POA: Diagnosis present

## 2023-11-26 DIAGNOSIS — O99214 Obesity complicating childbirth: Principal | ICD-10-CM | POA: Diagnosis present

## 2023-11-26 LAB — CBC
HCT: 38 % (ref 36.0–46.0)
Hemoglobin: 12.4 g/dL (ref 12.0–15.0)
MCH: 27.3 pg (ref 26.0–34.0)
MCHC: 32.6 g/dL (ref 30.0–36.0)
MCV: 83.5 fL (ref 80.0–100.0)
Platelets: 388 10*3/uL (ref 150–400)
RBC: 4.55 MIL/uL (ref 3.87–5.11)
RDW: 15 % (ref 11.5–15.5)
WBC: 15 10*3/uL — ABNORMAL HIGH (ref 4.0–10.5)
nRBC: 0 % (ref 0.0–0.2)

## 2023-11-26 LAB — TYPE AND SCREEN
ABO/RH(D): O POS
Antibody Screen: NEGATIVE

## 2023-11-26 LAB — RPR: RPR Ser Ql: NONREACTIVE

## 2023-11-26 MED ORDER — ZOLPIDEM TARTRATE 5 MG PO TABS: 5.0000 mg | ORAL_TABLET | Freq: Every evening | ORAL | Status: DC | PRN

## 2023-11-26 MED ORDER — FENTANYL-BUPIVACAINE-NACL 0.5-0.125-0.9 MG/250ML-% EP SOLN
12.0000 mL/h | EPIDURAL | Status: DC | PRN
  Filled 2023-11-26: qty 250

## 2023-11-26 MED ORDER — FLEET ENEMA RE ENEM: 1.0000 | ENEMA | Freq: Every day | RECTAL | Status: DC | PRN

## 2023-11-26 MED ORDER — PHENYLEPHRINE 80 MCG/ML (10ML) SYRINGE FOR IV PUSH (FOR BLOOD PRESSURE SUPPORT): 80.0000 ug | PREFILLED_SYRINGE | INTRAVENOUS | Status: DC | PRN

## 2023-11-26 MED ORDER — WITCH HAZEL-GLYCERIN EX PADS: 1.0000 | MEDICATED_PAD | CUTANEOUS | Status: DC | PRN

## 2023-11-26 MED ORDER — EPHEDRINE 5 MG/ML INJ: 10.0000 mg | INTRAVENOUS | Status: DC | PRN

## 2023-11-26 MED ORDER — OXYCODONE HCL 5 MG PO TABS: 10.0000 mg | ORAL_TABLET | ORAL | Status: DC | PRN

## 2023-11-26 MED ORDER — ACETAMINOPHEN 325 MG PO TABS: 650.0000 mg | ORAL_TABLET | ORAL | Status: DC | PRN

## 2023-11-26 MED ORDER — SIMETHICONE 80 MG PO CHEW: 80.0000 mg | CHEWABLE_TABLET | ORAL | Status: DC | PRN

## 2023-11-26 MED ORDER — OXYTOCIN-SODIUM CHLORIDE 30-0.9 UT/500ML-% IV SOLN
2.5000 [IU]/h | INTRAVENOUS | Status: DC
Start: 2023-11-26 — End: 2023-11-26
  Administered 2023-11-26: 2.5 [IU]/h via INTRAVENOUS
  Filled 2023-11-26 (×2): qty 500

## 2023-11-26 MED ORDER — FENTANYL CITRATE (PF) 100 MCG/2ML IJ SOLN
INTRAMUSCULAR | Status: AC
  Filled 2023-11-26: qty 2

## 2023-11-26 MED ORDER — OXYCODONE HCL 5 MG PO TABS: 5.0000 mg | ORAL_TABLET | ORAL | Status: DC | PRN

## 2023-11-26 MED ORDER — SOD CITRATE-CITRIC ACID 500-334 MG/5ML PO SOLN: 30.0000 mL | ORAL | Status: DC | PRN

## 2023-11-26 MED ORDER — SERTRALINE HCL 50 MG PO TABS
50.0000 mg | ORAL_TABLET | Freq: Every day | ORAL | Status: DC
Start: 2023-11-26 — End: 2023-11-27

## 2023-11-26 MED ORDER — BUPIVACAINE HCL (PF) 0.25 % IJ SOLN
INTRAMUSCULAR | Status: DC | PRN
  Administered 2023-11-26: 1.2 mL via INTRATHECAL

## 2023-11-26 MED ORDER — SENNOSIDES-DOCUSATE SODIUM 8.6-50 MG PO TABS
2.0000 | ORAL_TABLET | Freq: Every day | ORAL | Status: DC
  Administered 2023-11-27: 2 via ORAL
  Filled 2023-11-26: qty 2

## 2023-11-26 MED ORDER — IBUPROFEN 600 MG PO TABS
600.0000 mg | ORAL_TABLET | Freq: Four times a day (QID) | ORAL | Status: DC
  Administered 2023-11-26 – 2023-11-27 (×5): 600 mg via ORAL
  Filled 2023-11-26 (×5): qty 1

## 2023-11-26 MED ORDER — OXYCODONE-ACETAMINOPHEN 5-325 MG PO TABS: 2.0000 | ORAL_TABLET | ORAL | Status: DC | PRN

## 2023-11-26 MED ORDER — LIDOCAINE HCL (PF) 1 % IJ SOLN: 30.0000 mL | INTRAMUSCULAR | Status: DC | PRN

## 2023-11-26 MED ORDER — MEDROXYPROGESTERONE ACETATE 150 MG/ML IM SUSP: 150.0000 mg | INTRAMUSCULAR | Status: DC | PRN

## 2023-11-26 MED ORDER — COCONUT OIL OIL: 1.0000 | TOPICAL_OIL | Status: DC | PRN

## 2023-11-26 MED ORDER — BENZOCAINE-MENTHOL 20-0.5 % EX AERO: 1.0000 | INHALATION_SPRAY | CUTANEOUS | Status: DC | PRN

## 2023-11-26 MED ORDER — BUPIVACAINE HCL (PF) 0.25 % IJ SOLN
INTRAMUSCULAR | Status: DC | PRN
  Administered 2023-11-26: 6 mL via EPIDURAL

## 2023-11-26 MED ORDER — FENTANYL CITRATE (PF) 100 MCG/2ML IJ SOLN
INTRAMUSCULAR | Status: DC | PRN
  Administered 2023-11-26: 100 ug via EPIDURAL

## 2023-11-26 MED ORDER — DIPHENHYDRAMINE HCL 25 MG PO CAPS: 25.0000 mg | ORAL_CAPSULE | Freq: Four times a day (QID) | ORAL | Status: DC | PRN

## 2023-11-26 MED ORDER — LACTATED RINGERS IV SOLN: 500.0000 mL | INTRAVENOUS | Status: DC | PRN

## 2023-11-26 MED ORDER — FENTANYL-BUPIVACAINE-NACL 0.5-0.125-0.9 MG/250ML-% EP SOLN
EPIDURAL | Status: DC | PRN
  Administered 2023-11-26: 12 mL/h via EPIDURAL

## 2023-11-26 MED ORDER — DIBUCAINE (PERIANAL) 1 % EX OINT: 1.0000 | TOPICAL_OINTMENT | CUTANEOUS | Status: DC | PRN

## 2023-11-26 MED ORDER — MEASLES, MUMPS & RUBELLA VAC IJ SOLR: 0.5000 mL | Freq: Once | INTRAMUSCULAR | Status: DC

## 2023-11-26 MED ORDER — FLEET ENEMA RE ENEM: 1.0000 | ENEMA | RECTAL | Status: DC | PRN

## 2023-11-26 MED ORDER — OXYCODONE-ACETAMINOPHEN 5-325 MG PO TABS: 1.0000 | ORAL_TABLET | ORAL | Status: DC | PRN

## 2023-11-26 MED ORDER — DIPHENHYDRAMINE HCL 50 MG/ML IJ SOLN: 12.5000 mg | INTRAMUSCULAR | Status: DC | PRN

## 2023-11-26 MED ORDER — LIDOCAINE-EPINEPHRINE (PF) 2 %-1:200000 IJ SOLN
INTRAMUSCULAR | Status: DC | PRN
  Administered 2023-11-26: 5 mL via EPIDURAL

## 2023-11-26 MED ORDER — BISACODYL 10 MG RE SUPP: 10.0000 mg | Freq: Every day | RECTAL | Status: DC | PRN

## 2023-11-26 MED ORDER — ONDANSETRON HCL 4 MG/2ML IJ SOLN: 4.0000 mg | INTRAMUSCULAR | Status: DC | PRN

## 2023-11-26 MED ORDER — LACTATED RINGERS IV SOLN
500.0000 mL | Freq: Once | INTRAVENOUS | Status: AC
Start: 2023-11-26 — End: 2023-11-26
  Administered 2023-11-26: 500 mL via INTRAVENOUS

## 2023-11-26 MED ORDER — PRENATAL MULTIVITAMIN CH
1.0000 | ORAL_TABLET | Freq: Every day | ORAL | Status: DC
Start: 2023-11-26 — End: 2023-11-27
  Administered 2023-11-26 – 2023-11-27 (×2): 1 via ORAL
  Filled 2023-11-26 (×2): qty 1

## 2023-11-26 MED ORDER — LACTATED RINGERS IV SOLN: INTRAVENOUS | Status: DC

## 2023-11-26 MED ORDER — TETANUS-DIPHTH-ACELL PERTUSSIS 5-2.5-18.5 LF-MCG/0.5 IM SUSY: 0.5000 mL | PREFILLED_SYRINGE | Freq: Once | INTRAMUSCULAR | Status: DC

## 2023-11-26 MED ORDER — PHENYLEPHRINE 80 MCG/ML (10ML) SYRINGE FOR IV PUSH (FOR BLOOD PRESSURE SUPPORT)
80.0000 ug | PREFILLED_SYRINGE | INTRAVENOUS | Status: DC | PRN
Start: 2023-11-26 — End: 2023-11-26

## 2023-11-26 MED ORDER — OXYTOCIN BOLUS FROM INFUSION
333.0000 mL | Freq: Once | INTRAVENOUS | Status: AC
  Administered 2023-11-26: 333 mL via INTRAVENOUS

## 2023-11-26 MED ORDER — ONDANSETRON HCL 4 MG/2ML IJ SOLN: 4.0000 mg | Freq: Four times a day (QID) | INTRAMUSCULAR | Status: DC | PRN

## 2023-11-26 MED ORDER — ONDANSETRON HCL 4 MG PO TABS
4.0000 mg | ORAL_TABLET | ORAL | Status: DC | PRN
Start: 2023-11-26 — End: 2023-11-27

## 2023-11-26 NOTE — H&P (Addendum)
Emma Spencer is a 32 y.o.G 3 P 2002 at 39 w 4 days presents in active labor. Received epidural. OB History     Gravida  3   Para  2   Term  2   Preterm      AB      Living  2      SAB      IAB      Ectopic      Multiple  0   Live Births  2          Past Medical History:  Diagnosis Date   Anemia    Past Surgical History:  Procedure Laterality Date   WISDOM TOOTH EXTRACTION     Family History: family history includes Anxiety disorder in her maternal aunt and mother; Depression in her maternal aunt and mother; Diabetes in her father; Heart disease in her paternal grandfather; Hypertension in her father and mother; Multiple sclerosis in her maternal aunt; Rheum arthritis in her maternal aunt; Stomach cancer in her maternal grandmother. Social History:  reports that she has never smoked. She has never used smokeless tobacco. She reports that she does not drink alcohol and does not use drugs.     Maternal Diabetes: No Genetic Screening: Normal Maternal Ultrasounds/Referrals: Normal Fetal Ultrasounds or other Referrals:  None Maternal Substance Abuse:  No Significant Maternal Medications:  None Significant Maternal Lab Results:  None Number of Prenatal Visits:greater than 3 verified prenatal visits Maternal Vaccinations:TDap and Flu Other Comments:  None  Review of Systems  All other systems reviewed and are negative.  Maternal Medical History:  Reason for admission: Contractions.     Dilation: 8.5 Effacement (%): 100 Station: -1 Exam by:: Dr Vincente Poli Blood pressure (!) 115/56, pulse 76, temperature 98 F (36.7 C), temperature source Oral, resp. rate 17, height 5\' 6"  (1.676 m), weight 105.2 kg, SpO2 98%, unknown if currently breastfeeding. Maternal Exam:  Uterine Assessment: Contraction frequency is regular.  Abdomen: Fetal presentation: vertex   Fetal Exam Fetal State Assessment: Category I - tracings are normal.   Physical Exam Vitals and  nursing note reviewed. Exam conducted with a chaperone present.  Constitutional:      Appearance: Normal appearance.  HENT:     Head: Normocephalic.     Right Ear: Tympanic membrane normal.     Left Ear: Tympanic membrane normal.  Eyes:     Pupils: Pupils are equal, round, and reactive to light.  Cardiovascular:     Rate and Rhythm: Normal rate and regular rhythm.  Neurological:     Mental Status: She is alert.     Prenatal labs: ABO, Rh: --/--/O POS (12/14 8119) Antibody: NEG (12/14 0636) Rubella: Immune (05/16 0000) RPR: Nonreactive (05/16 0000)  HBsAg: Negative (05/16 0000)  HIV: Non-reactive (05/16 0000)  GBS: Negative/-- (11/20 0000)   Assessment/Plan: IUP at term Labor  Anticipate NSVD   Jeani Hawking 11/26/2023, 8:03 AM

## 2023-11-26 NOTE — Plan of Care (Signed)
Pt demonstrated understanding 

## 2023-11-26 NOTE — Anesthesia Preprocedure Evaluation (Signed)
Anesthesia Evaluation  Patient identified by MRN, date of birth, ID band Patient awake    Reviewed: Allergy & Precautions, Patient's Chart, lab work & pertinent test results  Airway Mallampati: II  TM Distance: >3 FB Neck ROM: Full    Dental no notable dental hx.    Pulmonary neg pulmonary ROS   Pulmonary exam normal breath sounds clear to auscultation       Cardiovascular negative cardio ROS Normal cardiovascular exam Rhythm:Regular Rate:Normal     Neuro/Psych negative neurological ROS  negative psych ROS   GI/Hepatic negative GI ROS, Neg liver ROS,,,  Endo/Other  Obesity BMI 37  Renal/GU negative Renal ROS  negative genitourinary   Musculoskeletal negative musculoskeletal ROS (+)    Abdominal   Peds negative pediatric ROS (+)  Hematology negative hematology ROS (+) Hb 12.4, plt 388   Anesthesia Other Findings   Reproductive/Obstetrics (+) Pregnancy                             Anesthesia Physical Anesthesia Plan  ASA: 2  Anesthesia Plan: Epidural   Post-op Pain Management:    Induction:   PONV Risk Score and Plan: 2  Airway Management Planned: Natural Airway  Additional Equipment: None  Intra-op Plan:   Post-operative Plan:   Informed Consent: I have reviewed the patients History and Physical, chart, labs and discussed the procedure including the risks, benefits and alternatives for the proposed anesthesia with the patient or authorized representative who has indicated his/her understanding and acceptance.       Plan Discussed with:   Anesthesia Plan Comments:        Anesthesia Quick Evaluation

## 2023-11-26 NOTE — Progress Notes (Signed)
Wrong  column 

## 2023-11-26 NOTE — Lactation Note (Signed)
This note was copied from a baby's chart. Lactation Consultation Note  Patient Name: Emma Spencer [id number] Date: 11/26/2023 Age:33 hours Reason for consult: Follow-up assessment  P2, Mother plans to breastfeed, pump and formula feed. Baby was latched when [facility] entered room. Noted intermittent swallows. Fitted mother with 18 mm flanges.  May need smaller.  Mother given information about flange insert kits. Reviewed pump use.  Give formula after breastfeeding if desired.  Provided volume guidelines.  Maternal Data Has patient been taught Hand Expression?: Yes Does the patient have breastfeeding experience prior to this delivery?: Yes How long did the patient breastfeed?: a few months  Feeding Mother's Current Feeding Choice: Breast Milk and Formula  LATCH Score Latch: Grasps breast easily, tongue down, lips flanged, rhythmical sucking.  Audible Swallowing: A few with stimulation  Type of Nipple: Everted at rest and after stimulation  Comfort (Breast/Nipple): Soft / non-tender  Hold (Positioning): No assistance needed to correctly position infant at breast.  LATCH Score: 9   Lactation Tools Discussed/Used Tools: Pump;Flanges Flange Size: 18 Breast pump type: Double-Electric Breast Pump;Manual Pump Education: Setup, frequency, and cleaning;Milk Storage Reason for Pumping: mother' choice Pumping frequency: q 3hours  Interventions Interventions: Breast feeding basics reviewed;Education;LPT handout/interventions  Discharge Pump: Personal;DEBP (Spectra)  Consult Status Consult Status: Follow-up Date: 11/27/23 Follow-up type: In-patient    [name] [facility] 11/26/2023, 2:49 PM

## 2023-11-26 NOTE — MAU Note (Signed)
.  Emma Spencer is a 32 y.o. at [redacted]w[redacted]d here in MAU reporting: d/c home earlier this evening for rule out labor - ctx more intense and every 5 minutes. Denies VB or LOF. +FM   Pain score: 9 Vitals:   11/26/23 0620  BP: 135/70  Pulse: 84  Resp: 20  Temp: 97.7 F (36.5 C)  SpO2: 99%     FHT:150 Lab orders placed from triage:  labor eval

## 2023-11-26 NOTE — Anesthesia Procedure Notes (Addendum)
Epidural Patient location during procedure: OB Start time: 11/26/2023 7:05 AM End time: 11/26/2023 7:13 AM  Staffing Anesthesiologist: Lannie Fields, DO Performed: anesthesiologist   Preanesthetic Checklist Completed: patient identified, IV checked, risks and benefits discussed, monitors and equipment checked, pre-op evaluation and timeout performed  Epidural Patient position: sitting Prep: DuraPrep and site prepped and draped Patient monitoring: continuous pulse ox, blood pressure, heart rate and cardiac monitor Approach: midline Location: L3-L4 Injection technique: LOR air  Needle:  Needle type: Tuohy  Needle gauge: 17 G Needle length: 9 cm Needle insertion depth: 5 cm Catheter type: closed end flexible Catheter size: 19 Gauge Catheter at skin depth: 10 cm Test dose: negative  Assessment Sensory level: T8 Events: blood not aspirated, no cerebrospinal fluid, injection not painful, no injection resistance, no paresthesia and negative IV test  Additional Notes Patient identified. Risks/Benefits/Options discussed with patient including but not limited to bleeding, infection, nerve damage, paralysis, failed block, incomplete pain control, headache, blood pressure changes, nausea, vomiting, reactions to medication both or allergic, itching and postpartum back pain. Confirmed with bedside nurse the patient's most recent platelet count. Confirmed with patient that they are not currently taking any anticoagulation, have any bleeding history or any family history of bleeding disorders. Patient expressed understanding and wished to proceed. All questions were answered. Sterile technique was used throughout the entire procedure. Please see nursing notes for vital signs. Test dose was given through epidural catheter and negative prior to continuing to dose epidural or start infusion. Warning signs of high block given to the patient including shortness of breath, tingling/numbness in  hands, complete motor block, or any concerning symptoms with instructions to call for help. Patient was given instructions on fall risk and not to get out of bed. All questions and concerns addressed with instructions to call with any issues or inadequate analgesia.    CSE performed w/ 24G pencan through tuohy, no issues.Reason for block:procedure for pain

## 2023-11-26 NOTE — Progress Notes (Signed)
MOB was referred for history of depression/anxiety. * Referral screened out by Clinical Social Worker because none of the following criteria appear to apply: ~ History of anxiety/depression during this pregnancy, or of post-partum depression. ~ Diagnosis of anxiety and/or depression within last 3 years OR * MOB's symptoms currently being treated with medication and/or therapy. MOB has an active Rx for Zoloft. No concerns were noted in OB record.   Please contact the Clinical Social Worker if needs arise, or if MOB requests.  Blaine Hamper, MSW, LCSW Clinical Social Work [phone]

## 2023-11-27 LAB — CBC
HCT: 34.7 % — ABNORMAL LOW (ref 36.0–46.0)
Hemoglobin: 11.3 g/dL — ABNORMAL LOW (ref 12.0–15.0)
MCH: 27.2 pg (ref 26.0–34.0)
MCHC: 32.6 g/dL (ref 30.0–36.0)
MCV: 83.6 fL (ref 80.0–100.0)
Platelets: 324 10*3/uL (ref 150–400)
RBC: 4.15 MIL/uL (ref 3.87–5.11)
RDW: 15.1 % (ref 11.5–15.5)
WBC: 10.7 10*3/uL — ABNORMAL HIGH (ref 4.0–10.5)
nRBC: 0 % (ref 0.0–0.2)

## 2023-11-27 NOTE — Lactation Note (Signed)
This note was copied from a baby's chart. Lactation Consultation Note  Patient Name: Emma Spencer [id number] Date: 11/27/2023 Age:33 hours Reason for consult: Follow-up assessment  P2, Mother continues to breastfeed and supplement after with formula.  She feels her breasts filling.  Encouraged her to offer breast or pump to establish her milk supply. Reassured mother that cluster is normal. Reviewed engorgement care and monitoring voids/stools.   Maternal Data Has patient been taught Hand Expression?: Yes  Feeding Mother's Current Feeding Choice: Breast Milk and Formula  Interventions Interventions: Education  Discharge Discharge Education: Engorgement and breast care;Warning signs for feeding baby  Consult Status Consult Status: Complete Date: 11/27/23    [name] [facility] 11/27/2023, 2:10 PM

## 2023-11-27 NOTE — Discharge Summary (Signed)
Postpartum Discharge Summary  Date of Service November 27, 2023     Patient Name: Emma Spencer DOB: [birth year] MRN: [id number]  Date of admission: 11/26/2023 Delivery date:11/26/2023 Delivering provider: Marcelle Overlie Date of discharge: 11/27/2023  Admitting diagnosis: Normal labor [O80, Z37.9] NSVD (normal spontaneous vaginal delivery) [O80] Intrauterine pregnancy: [redacted]w[redacted]d     Secondary diagnosis:  Principal Problem:   Normal labor Active Problems:   NSVD (normal spontaneous vaginal delivery)  Additional problems: none    Discharge diagnosis: Term Pregnancy Delivered                                              Post partum procedures: not applicable Augmentation: N/A Complications: None  Hospital course: Onset of Labor With Vaginal Delivery      32 y.o. yo G3P3003 at [redacted]w[redacted]d was admitted in Active Labor on 11/26/2023. Labor course was complicated bynothing  Membrane Rupture Time/Date: 7:39 AM,11/26/2023  Delivery Method:Vaginal, Spontaneous Operative Delivery:N/A Episiotomy: None Lacerations:  None Patient had a postpartum course complicated by nothing.  She is ambulating, tolerating a regular diet, passing flatus, and urinating well. Patient is discharged home in stable condition on 11/27/23.  Newborn Data: Birth date:11/26/2023 Birth time:8:24 AM Gender:Female Living status:Living Apgars:8 ,9  Weight:3330 g  Magnesium Sulfate received: No BMZ received: No Rhophylac:N/A MMR:N/A T-DaP:Given prenatally Flu: Yes RSV Vaccine received: No Transfusion:No Immunizations administered: There is no immunization history for the selected administration types on file for this patient.  Physical exam  Vitals:   11/26/23 2012 11/27/23 0011 11/27/23 0500 11/27/23 0616  BP: 122/70 137/82 139/74 127/66  Pulse: 77 79 75 75  Resp: 18 18 18 18   Temp: 98.3 F (36.8 C) 98.3 F (36.8 C) 98.3 F (36.8 C)   TempSrc: Oral Oral Oral   SpO2: 98% 98% 98%   Weight:       Height:       General: alert, cooperative, and no distress Lochia: appropriate Uterine Fundus: firm Incision: Healing well with no significant drainage DVT Evaluation: No evidence of DVT seen on physical exam. Labs: Lab Results  Component Value Date   WBC 10.7 (H) 11/27/2023   HGB 11.3 (L) 11/27/2023   HCT 34.7 (L) 11/27/2023   MCV 83.6 11/27/2023   PLT 324 11/27/2023       No data to display         Edinburgh Score:     No data to display            After visit meds:  Allergies as of 11/27/2023   No Known Allergies      Medication List     TAKE these medications    CitraNatal Assure 35-1 & 300 MG tablet Take 2 tablets by mouth daily.   docusate sodium 100 MG capsule Commonly known as: COLACE Take 100 mg by mouth daily.   sertraline 50 MG tablet Commonly known as: ZOLOFT Take 50 mg by mouth daily.         Discharge home in stable condition Infant Feeding: Breast Infant Disposition:home with mother Discharge instruction: per After Visit Summary and Postpartum booklet. Activity: Advance as tolerated. Pelvic rest for 6 weeks.  Diet: routine diet Anticipated Birth Control: Unsure Postpartum Appointment:6 weeks Additional Postpartum F/U:  not applicable Future Appointments:No future appointments. Follow up Visit:      11/27/2023 Jeani Hawking, MD

## 2023-11-27 NOTE — Anesthesia Postprocedure Evaluation (Signed)
Anesthesia Post Note  Patient: Emma Spencer  Procedure(s) Performed: AN AD HOC LABOR EPIDURAL     Patient location during evaluation: Mother Baby Anesthesia Type: Epidural Level of consciousness: awake, oriented and awake and alert Pain management: pain level controlled Vital Signs Assessment: post-procedure vital signs reviewed and stable Respiratory status: spontaneous breathing, nonlabored ventilation and respiratory function stable Cardiovascular status: blood pressure returned to baseline and stable Postop Assessment: no headache, no backache, no apparent nausea or vomiting, patient able to bend at knees, adequate PO intake and able to ambulate Anesthetic complications: no   No notable events documented.  Last Vitals:  Vitals:   11/27/23 0500 11/27/23 0616  BP: 139/74 127/66  Pulse: 75 75  Resp: 18 18  Temp: 36.8 C   SpO2: 98%     Last Pain:  Vitals:   11/27/23 0616  TempSrc:   PainSc: 0-No pain   Pain Goal: Patients Stated Pain Goal: 4 (11/26/23 0725)                 Chrisa Hassan

## 2023-11-29 ENCOUNTER — Inpatient Hospital Stay (HOSPITAL_COMMUNITY): Admission: RE | Admit: 2023-11-29 | Payer: 59 | Source: Home / Self Care | Admitting: Obstetrics and Gynecology

## 2023-11-29 LAB — BIRTH TISSUE RECOVERY COLLECTION (PLACENTA DONATION)

## 2023-12-05 ENCOUNTER — Telehealth (HOSPITAL_COMMUNITY): Payer: Self-pay

## 2023-12-05 NOTE — Telephone Encounter (Signed)
12/05/2023 1610  Name: Shalawn Stavropoulos MRN: [id number] DOB: 1991/11/17  Reason for Call:  Transition of Care Hospital Discharge Call  Contact Status: Patient Contact Status: Message  Language assistant needed:          Follow-Up Questions:    Inocente Salles Postnatal Depression Scale:  In the Past 7 Days:    PHQ2-9 Depression Scale:     Discharge Follow-up:    Post-discharge interventions: NA  Signature  Signe Colt

## 2023-12-29 ENCOUNTER — Encounter (HOSPITAL_BASED_OUTPATIENT_CLINIC_OR_DEPARTMENT_OTHER): Payer: Self-pay | Admitting: Family Medicine

## 2023-12-29 ENCOUNTER — Ambulatory Visit (INDEPENDENT_AMBULATORY_CARE_PROVIDER_SITE_OTHER): Payer: 59 | Admitting: Family Medicine

## 2023-12-29 VITALS — BP 114/67 | HR 71 | Ht 66.0 in | Wt 217.6 lb

## 2023-12-29 DIAGNOSIS — Q079 Congenital malformation of nervous system, unspecified: Secondary | ICD-10-CM | POA: Insufficient documentation

## 2023-12-29 DIAGNOSIS — F902 Attention-deficit hyperactivity disorder, combined type: Secondary | ICD-10-CM

## 2023-12-29 NOTE — Progress Notes (Signed)
New Patient Office Visit  Subjective:   Emma Spencer 04-20-1991 12/29/2023  Chief Complaint  Patient presents with   New Patient (Initial Visit)    Patient is here today to get established with the practice. Denies any main concerns for today's visit.    HPI: Emma Spencer presents today to establish care at [facility]. Introduced to Publishing rights manager role and practice setting.  All questions answered.   Last PCP: [facility] Last annual physical: approx. 2 years ago  Concerns: See below   ADD/ADHD EVALUATION [name] presents for the evaluation of ADD/ADHD. She is currently on Concerta for ADHD. She recently restarted it after pregnancy within the past week. Delivery was on 11/26/23.  Work/school performance:  good Difficulty sustaining attention/completing tasks:  yes, previously. Doing well on current dosage.  Distracted by extraneous stimuli: no  Previous psychiatry evaluation: yes, she current is managed by Psychiatrist (Dr. Mathis Dad) who manages Concerta. She has an appt in 1 month for follow up.    POSTPARTUM STATE:  Postpartum appt on 01/04/24 with OBGYN from recent delivery in December. Patient is doing well. She declines concern of postpartum depression. She is not currently breastfeeding. BP is normal.   The following portions of the patient's history were reviewed and updated as appropriate: past medical history, past surgical history, family history, social history, allergies, medications, and problem list.   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   Attention deficit hyperactivity disorder (ADHD), combined type 12/29/2023   Normal labor 11/26/2023   NSVD (normal spontaneous vaginal delivery) 11/26/2023   Term pregnancy 02/27/2019   Second-degree perineal laceration, with delivery 09/15/2016   Palpitations  04/19/2016   Past Medical History:  Diagnosis Date   Anemia    Anxiety    Depression    Past Surgical History:  Procedure Laterality Date   WISDOM TOOTH EXTRACTION     Family History  Problem Relation Age of Onset   Hypertension Mother    Anxiety disorder Mother    Depression Mother    Hypertension Father    Diabetes Father    Rheum arthritis Maternal Aunt    Multiple sclerosis Maternal Aunt    Anxiety disorder Maternal Aunt    Depression Maternal Aunt    Stomach cancer Maternal Grandmother    Cancer Maternal Grandmother    Heart disease Paternal Grandfather    Arthritis Maternal Aunt    Asthma Maternal Aunt    Social History   Socioeconomic History   Marital status: Married    Spouse name: Not on file   Number of children: 0   Years of education: Not on file   Highest education level: Master's degree (e.g., MA, MS, MEng, MEd, MSW, MBA)  Occupational History   Occupation: pharmacy  Tobacco Use   Smoking status: Never   Smokeless tobacco: Never  Vaping Use   Vaping status: Never Used  Substance and Sexual Activity   Alcohol use: No   Drug use: No   Sexual activity: Yes    Birth control/protection: Condom  Other Topics Concern   Not on file  Social History Narrative   Not on file   Social Drivers of Health   Financial Resource Strain: Low Risk  (12/27/2023)   Overall Financial Resource Strain (CARDIA)    Difficulty of Paying Living Expenses: Not very hard  Food Insecurity: No Food Insecurity (12/27/2023)  Hunger Vital Sign    Worried About Running Out of Food in the Last Year: Never true    Ran Out of Food in the Last Year: Never true  Transportation Needs: No Transportation Needs (12/27/2023)   PRAPARE - Administrator, Civil Service (Medical): No    Lack of Transportation (Non-Medical): No  Physical Activity: Insufficiently Active (12/27/2023)   Exercise Vital Sign    Days of Exercise per Week: 2 days    Minutes of Exercise per Session: 60  min  Stress: Stress Concern Present (12/27/2023)   [employer] of Occupational Health - Occupational Stress Questionnaire    Feeling of Stress : Rather much  Social Connections: Moderately Isolated (12/27/2023)   Social Connection and Isolation Panel [NHANES]    Frequency of Communication with Friends and Family: More than three times a week    Frequency of Social Gatherings with Friends and Family: Once a week    Attends Religious Services: Never    Database administrator or Organizations: No    Attends Engineer, structural: Not on file    Marital Status: Married  Catering manager Violence: Not At Risk (12/29/2023)   Humiliation, Afraid, Rape, and Kick questionnaire    Fear of Current or Ex-Partner: No    Emotionally Abused: No    Physically Abused: No    Sexually Abused: No   Outpatient Medications Prior to Visit  Medication Sig Dispense Refill   methylphenidate (CONCERTA) 27 MG PO CR tablet Take 27 mg by mouth every morning.     Prenat w/o A-FE-Methfol-FA-DHA (PNV-DHA) 27-0.6-0.4-300 MG CAPS Take 1 capsule by mouth daily.     docusate sodium (COLACE) 100 MG capsule Take 100 mg by mouth daily.     Prenat w/o A-FeCbGl-DSS-FA-DHA (CITRANATAL ASSURE) 35-1 & 300 MG tablet Take 2 tablets by mouth daily.     sertraline (ZOLOFT) 50 MG tablet Take 50 mg by mouth daily.  5   No facility-administered medications prior to visit.   No Known Allergies  ROS: A complete ROS was performed with pertinent positives/negatives noted in the HPI. The remainder of the ROS are negative.   Objective:   Today's Vitals   12/29/23 0950  BP: 114/67  Pulse: 71  SpO2: 98%  Weight: 217 lb 9.6 oz (98.7 kg)  Height: 5\' 6"  (1.676 m)    GENERAL: Well-appearing, in NAD. Well nourished.  SKIN: Pink, warm and dry.  Head: Normocephalic. NECK: Trachea midline. Full ROM w/o pain or tenderness. RESPIRATORY: Chest wall symmetrical. Respirations even and non-labored.  EXTREMITIES: Without clubbing,  cyanosis, or edema.  NEUROLOGIC: No motor or sensory deficits. Steady, even gait. C2-C12 intact.  PSYCH/MENTAL STATUS: Alert, oriented x 3. Cooperative, appropriate mood and affect.     Assessment & Plan:  1. Attention deficit hyperactivity disorder (ADHD), combined type (Primary) Well controlled currently and managed by Psychiatry. Pt will continue medication management by Psych and has upcoming appt in 1 month.   2. Postpartum state Follow up with OBGYN for 6 week postpartum visit scheduled for 01/04/24. Doing well overall and adjusting well. Discussed postpartum state and self care for patient.    Patient to reach out to office if new, worrisome, or unresolved symptoms arise or if no improvement in patient's condition. Patient verbalized understanding and is agreeable to treatment plan. All questions answered to patient's satisfaction.    Return in about 6 weeks (around 02/09/2024) for ANNUAL PHYSICAL.    Hilbert Bible, [place]

## 2024-02-09 ENCOUNTER — Encounter (HOSPITAL_BASED_OUTPATIENT_CLINIC_OR_DEPARTMENT_OTHER): Payer: Self-pay | Admitting: Family Medicine

## 2024-02-09 ENCOUNTER — Ambulatory Visit (INDEPENDENT_AMBULATORY_CARE_PROVIDER_SITE_OTHER): Payer: 59 | Admitting: Family Medicine

## 2024-02-09 ENCOUNTER — Encounter (HOSPITAL_BASED_OUTPATIENT_CLINIC_OR_DEPARTMENT_OTHER): Payer: Self-pay | Admitting: *Deleted

## 2024-02-09 ENCOUNTER — Other Ambulatory Visit (HOSPITAL_COMMUNITY)
Admission: RE | Admit: 2024-02-09 | Discharge: 2024-02-09 | Disposition: A | Discharge status: Home / Self Care | Payer: 59 | Source: Ambulatory Visit | Attending: Family Medicine | Admitting: Family Medicine

## 2024-02-09 VITALS — BP 111/70 | HR 76 | Ht 66.0 in | Wt 217.0 lb

## 2024-02-09 DIAGNOSIS — Z202 Contact with and (suspected) exposure to infections with a predominantly sexual mode of transmission: Secondary | ICD-10-CM

## 2024-02-09 DIAGNOSIS — Z975 Presence of (intrauterine) contraceptive device: Secondary | ICD-10-CM | POA: Insufficient documentation

## 2024-02-09 DIAGNOSIS — Z13220 Encounter for screening for lipoid disorders: Secondary | ICD-10-CM | POA: Diagnosis not present

## 2024-02-09 DIAGNOSIS — Z114 Encounter for screening for human immunodeficiency virus [HIV]: Secondary | ICD-10-CM

## 2024-02-09 DIAGNOSIS — Z0000 Encounter for general adult medical examination without abnormal findings: Secondary | ICD-10-CM

## 2024-02-09 DIAGNOSIS — E669 Obesity, unspecified: Secondary | ICD-10-CM | POA: Insufficient documentation

## 2024-02-09 DIAGNOSIS — Z1159 Encounter for screening for other viral diseases: Secondary | ICD-10-CM

## 2024-02-09 NOTE — Progress Notes (Signed)
 Subjective:   Emma Spencer [birth year]  02/09/2024   CC: Chief Complaint  Patient presents with   Annual Exam    Patient is here today for her physical. Denies any concerns for today's visit.    HPI: Emma Spencer is a 33 y.o. female who presents for a routine health maintenance exam.  Labs collected at time of visit.    HEALTH SCREENINGS: - Vision Screening:  N/a Hx of Lasik Eye Surgery - Dental Visits: up to date - Pap smear:  Done w/ OBGYN (Dr. Vincente Poli)- will obtain record - Breast Exam: up to date - STD Screening: Declined - Mammogram (40+): Not applicable  - Colonoscopy (45+): Not applicable  - Bone Density (65+ or under 65 with predisposing conditions): Not applicable  - Lung CA screening with low-dose CT:  Not applicable Adults age 61-80 who are current cigarette smokers or quit within the last 15 years. Must have 20 pack year history.   Depression and Anxiety Screen done today and results listed below:     02/09/2024    8:07 AM 12/29/2023    9:53 AM  Depression screen PHQ 2/9  Decreased Interest 0 0  Down, Depressed, Hopeless 0 1  PHQ - 2 Score 0 1  Altered sleeping 3 3  Tired, decreased energy 3 3  Change in appetite 0 0  Feeling bad or failure about yourself  0 0  Trouble concentrating 1 1  Moving slowly or fidgety/restless 1 1  Suicidal thoughts 0 0  PHQ-9 Score 8 9  Difficult doing work/chores Somewhat difficult Somewhat difficult      02/09/2024    8:07 AM 12/29/2023    9:55 AM  GAD 7 : Generalized Anxiety Score  Nervous, Anxious, on Edge 3 2  Control/stop worrying 1 1  Worry too much - different things 2 2  Trouble relaxing 2 1  Restless 1 1  Easily annoyed or irritable 1 1  Afraid - awful might happen 0 1  Total GAD 7 Score 10 9  Anxiety Difficulty Somewhat difficult Somewhat difficult    IMMUNIZATIONS: - Tdap: Tetanus vaccination status reviewed: last tetanus booster within 10 years. - HPV: Not applicable - Influenza: Up to date -  Pneumovax: Not applicable - Prevnar 20: Not applicable - Shingrix (50+): Not applicable   Past medical history, surgical history, medications, allergies, family history and social history reviewed with patient today and changes made to appropriate areas of the chart.   Past Medical History:  Diagnosis Date   Anemia    Anxiety    Depression    Normal labor 11/26/2023   NSVD (normal spontaneous vaginal delivery) 11/26/2023   Palpitations 04/19/2016   Second-degree perineal laceration, with delivery 09/15/2016   Term pregnancy 02/27/2019    Past Surgical History:  Procedure Laterality Date   WISDOM TOOTH EXTRACTION      Current Outpatient Medications on File Prior to Visit  Medication Sig   methylphenidate (CONCERTA) 27 MG PO CR tablet Take 27 mg by mouth every morning.   Prenat w/o A-FE-Methfol-FA-DHA (PNV-DHA) 27-0.6-0.4-300 MG CAPS Take 1 capsule by mouth daily.   No current facility-administered medications on file prior to visit.    No Known Allergies   Social History   Socioeconomic History   Marital status: Married    Spouse name: Not on file   Number of children: 0   Years of education: Not on file   Highest education level: Master's degree (e.g., MA, MS, MEng, MEd, MSW, MBA)  Occupational History   Occupation: pharmacy  Tobacco Use   Smoking status: Never   Smokeless tobacco: Never  Vaping Use   Vaping status: Never Used  Substance and Sexual Activity   Alcohol use: No   Drug use: No   Sexual activity: Yes    Birth control/protection: I.U.D.  Other Topics Concern   Not on file  Social History Narrative   Not on file   Social Drivers of Health   Financial Resource Strain: Low Risk  (12/27/2023)   Overall Financial Resource Strain (CARDIA)    Difficulty of Paying Living Expenses: Not very hard  Food Insecurity: No Food Insecurity (12/27/2023)   Hunger Vital Sign    Worried About Running Out of Food in the Last Year: Never true    Ran Out of Food  in the Last Year: Never true  Transportation Needs: No Transportation Needs (12/27/2023)   PRAPARE - Administrator, Civil Service (Medical): No    Lack of Transportation (Non-Medical): No  Physical Activity: Insufficiently Active (12/27/2023)   Exercise Vital Sign    Days of Exercise per Week: 2 days    Minutes of Exercise per Session: 60 min  Stress: Stress Concern Present (12/27/2023)   [employer] of Occupational Health - Occupational Stress Questionnaire    Feeling of Stress : Rather much  Social Connections: Moderately Isolated (12/27/2023)   Social Connection and Isolation Panel [NHANES]    Frequency of Communication with Friends and Family: More than three times a week    Frequency of Social Gatherings with Friends and Family: Once a week    Attends Religious Services: Never    Database administrator or Organizations: No    Attends Engineer, structural: Not on file    Marital Status: Married  Catering manager Violence: Not At Risk (12/29/2023)   Humiliation, Afraid, Rape, and Kick questionnaire    Fear of Current or Ex-Partner: No    Emotionally Abused: No    Physically Abused: No    Sexually Abused: No   Social History   Tobacco Use  Smoking Status Never  Smokeless Tobacco Never   Social History   Substance and Sexual Activity  Alcohol Use No    Family History  Problem Relation Age of Onset   Hypertension Mother    Anxiety disorder Mother    Depression Mother    Hypertension Father    Diabetes Father    Rheum arthritis Maternal Aunt    Multiple sclerosis Maternal Aunt    Anxiety disorder Maternal Aunt    Depression Maternal Aunt    Stomach cancer Maternal Grandmother    Cancer Maternal Grandmother    Heart disease Paternal Grandfather    Arthritis Maternal Aunt    Asthma Maternal Aunt      ROS: Denies fever, fatigue, unexplained weight loss/gain, chest pain, SHOB, and palpitations. Denies neurological deficits, gastrointestinal  or genitourinary complaints, and skin changes.   Objective:   Today's Vitals   02/09/24 0803  BP: 111/70  Pulse: 76  SpO2: 98%  Weight: 217 lb (98.4 kg)  Height: 5\' 6"  (1.676 m)    GENERAL APPEARANCE: Well-appearing, in NAD. Well nourished.  SKIN: Pink, warm and dry. Turgor normal. No rash, lesion, ulceration, or ecchymoses. Hair evenly distributed.  HEENT: HEAD: Normocephalic.  EYES: PERRLA. EOMI. Lids intact w/o defect. Sclera white, Conjunctiva pink w/o exudate.  EARS: External ear w/o redness, swelling, masses or lesions. EAC clear. TM's intact, translucent w/o bulging, appropriate  landmarks visualized. Appropriate acuity to conversational tones.  NOSE: Septum midline w/o deformity. Nares patent, mucosa pink and non-inflamed w/o drainage. No sinus tenderness.  THROAT: Uvula midline. Oropharynx clear. Tonsils non-inflamed w/o exudate. Oral mucosa pink and moist.  NECK: Supple, Trachea midline. Full ROM w/o pain or tenderness. No lymphadenopathy. Thyroid non-tender w/o enlargement or palpable masses.  BREASTS: Breasts pendulous, symmetrical, and w/o palpable masses. Nipples everted and w/o discharge. No rash or skin retraction. No axillary or supraclavicular lymphadenopathy.  RESPIRATORY: Chest wall symmetrical w/o masses. Respirations even and non-labored. Breath sounds clear to auscultation bilaterally. No wheezes, rales, rhonchi, or crackles. CARDIAC: S1, S2 present, regular rate and rhythm. No gallops, murmurs, rubs, or clicks. PMI w/o lifts, heaves, or thrills. No carotid bruits. Capillary refill <2 seconds. Peripheral pulses 2+ bilaterally. GI: Abdomen soft w/o distention. Normoactive bowel sounds. No palpable masses or tenderness. No guarding or rebound tenderness. Liver and spleen w/o tenderness or enlargement. No CVA tenderness.  GU: External genitalia without erythema, lesions, or masses. No lymphadenopathy. Vaginal mucosa pink and moist without exudate, lesions, or ulcerations.  Cervix pink without discharge. Cervical os closed. Uterus and adnexae palpable, not enlarged, and w/o tenderness. No palpable masses.  MSK: Muscle tone and strength appropriate for age, w/o atrophy or abnormal movement.  EXTREMITIES: Active ROM intact, w/o tenderness, crepitus, or contracture. No obvious joint deformities or effusions. No clubbing, edema, or cyanosis.  NEUROLOGIC: CN's II-XII intact. Motor strength symmetrical with no obvious weakness. No sensory deficits. DTR's 2+ symmetric bilaterally. Steady, even gait.  PSYCH/MENTAL STATUS: Alert, oriented x 3. Cooperative, appropriate mood and affect.   Chaperoned by Cristy Hilts, CMA   Results for orders placed or performed during the hospital encounter of 11/26/23  OB RESULTS CONSOLE RPR   Collection Time: 04/28/23 12:00 AM  Result Value Ref Range   RPR Nonreactive   OB RESULTS CONSOLE HIV antibody   Collection Time: 04/28/23 12:00 AM  Result Value Ref Range   HIV Non-reactive   OB RESULTS CONSOLE Rubella Antibody   Collection Time: 04/28/23 12:00 AM  Result Value Ref Range   Rubella Immune   OB RESULTS CONSOLE Hepatitis B surface antigen   Collection Time: 04/28/23 12:00 AM  Result Value Ref Range   Hepatitis B Surface Ag Negative   Hepatitis C antibody   Collection Time: 04/28/23 12:00 AM  Result Value Ref Range   HCV Ab Negative   OB RESULTS CONSOLE GC/Chlamydia   Collection Time: 05/05/23 12:00 AM  Result Value Ref Range   Chlamydia Negative    Neisseria Gonorrhea Negative   OB RESULT CONSOLE Group B Strep   Collection Time: 11/02/23 12:00 AM  Result Value Ref Range   GBS Negative   Type and screen [facility]   Collection Time: 11/26/23  6:36 AM  Result Value Ref Range   ABO/RH(D) O POS    Antibody Screen NEG    Sample Expiration      11/29/2023,2359 Performed at [facility], [address], [place], [place] [address]   CBC   Collection Time: 11/26/23  6:38 AM  Result Value Ref  Range   WBC 15.0 (H) 4.0 - 10.5 K/uL   RBC 4.55 3.87 - 5.11 MIL/uL   Hemoglobin 12.4 12.0 - 15.0 g/dL   HCT 21.3 08.6 - 57.8 %   MCV 83.5 80.0 - 100.0 fL   MCH 27.3 26.0 - 34.0 pg   MCHC 32.6 30.0 - 36.0 g/dL   RDW 15.0  11.5 - 15.5 %   Platelets 388 150 - 400 K/uL   nRBC 0.0 0.0 - 0.2 %  RPR   Collection Time: 11/26/23  6:38 AM  Result Value Ref Range   RPR Ser Ql NON REACTIVE NON REACTIVE  CBC   Collection Time: 11/27/23  4:59 AM  Result Value Ref Range   WBC 10.7 (H) 4.0 - 10.5 K/uL   RBC 4.15 3.87 - 5.11 MIL/uL   Hemoglobin 11.3 (L) 12.0 - 15.0 g/dL   HCT 16.1 (L) 09.6 - 04.5 %   MCV 83.6 80.0 - 100.0 fL   MCH 27.2 26.0 - 34.0 pg   MCHC 32.6 30.0 - 36.0 g/dL   RDW 40.9 81.1 - 91.4 %   Platelets 324 150 - 400 K/uL   nRBC 0.0 0.0 - 0.2 %  Collect bld for placenta donatation   Collection Time: 11/27/23  4:59 AM  Result Value Ref Range   Placenta donation bld collect Collected by Laboratory     Assessment & Plan:  1. Annual physical exam (Primary) Discussed preventative screenings, recommended vaccinations, and healthy lifestyle with patient and she verbalized understanding.  Will obtain fasting lab work today as part of annual exam and notify patient of results when available.  Will obtain Pap results from OB/GYN - Lipid panel - Comprehensive metabolic panel - CBC with Differential/Platelet - Hemoglobin A1c - TSH  2. Screening for lipid disorders Given comorbidity of obesity, will obtain lipid panel to rule out HLD.  No family history of familial HLD. - Lipid panel  3. Encounter for assessment of sexually transmitted disease exposure Per patient request.  She is currently asymptomatic and denies known exposure. - RPR - Cervicovaginal ancillary only  4. Encounter for screening for human immunodeficiency virus (HIV) Per patient request.  She is currently asymptomatic and denies known exposure - HIV Antibody (routine testing w rflx)  5. Encounter for hepatitis C  screening test for low risk patient Asymptomatic, patient requested screening - Hepatitis C antibody  6. Obesity (BMI 30-39.9) Will obtain A1c, TSH to rule out comorbidities contributing to weight.  - Hemoglobin A1c - TSH   Orders Placed This Encounter  Procedures   Lipid panel   Comprehensive metabolic panel   CBC with Differential/Platelet   Hemoglobin A1c   TSH   Hepatitis C antibody   HIV Antibody (routine testing w rflx)   RPR    PATIENT COUNSELING:  - Encouraged a healthy well-balanced diet. Patient may adjust caloric intake to maintain or achieve ideal body weight. May reduce intake of dietary saturated fat and total fat and have adequate dietary potassium and calcium preferably from fresh fruits, vegetables, and low-fat dairy products.   - Advised to avoid cigarette smoking. - Discussed with the patient that most people either abstain from alcohol or drink within safe limits (<=14/week and <=4 drinks/occasion for males, <=7/weeks and <= 3 drinks/occasion for females) and that the risk for alcohol disorders and other health effects rises proportionally with the number of drinks per week and how often a drinker exceeds daily limits. - Discussed cessation/primary prevention of drug use and availability of treatment for abuse.  - Discussed sexually transmitted diseases, avoidance of unintended pregnancy and contraceptive alternatives.  - Stressed the importance of regular exercise - Injury prevention: Discussed safety belts, safety helmets, smoke detector, smoking near bedding or upholstery.  - Dental health: Discussed importance of regular tooth brushing, flossing, and dental visits.   NEXT PREVENTATIVE PHYSICAL DUE IN 1 YEAR.  Return in about 1 year (around 02/08/2025) for ANNUAL PHYSICAL.    Patient to reach out to office if new, worrisome, or unresolved symptoms arise or if no improvement in patient's condition. Patient verbalized understanding and is agreeable to  treatment plan. All questions answered to patient's satisfaction.    Hilbert Bible, [place]

## 2024-02-09 NOTE — Patient Instructions (Signed)

## 2024-02-10 ENCOUNTER — Encounter (HOSPITAL_BASED_OUTPATIENT_CLINIC_OR_DEPARTMENT_OTHER): Payer: Self-pay | Admitting: Family Medicine

## 2024-02-10 LAB — COMPREHENSIVE METABOLIC PANEL
ALT: 38 [IU]/L — ABNORMAL HIGH (ref 0–32)
AST: 21 [IU]/L (ref 0–40)
Albumin: 4.6 g/dL (ref 3.9–4.9)
Alkaline Phosphatase: 114 [IU]/L (ref 44–121)
BUN/Creatinine Ratio: 21 (ref 9–23)
BUN: 12 mg/dL (ref 6–20)
Bilirubin Total: 0.9 mg/dL (ref 0.0–1.2)
CO2: 23 mmol/L (ref 20–29)
Calcium: 9.7 mg/dL (ref 8.7–10.2)
Chloride: 103 mmol/L (ref 96–106)
Creatinine, Ser: 0.57 mg/dL (ref 0.57–1.00)
Globulin, Total: 2.8 g/dL (ref 1.5–4.5)
Glucose: 91 mg/dL (ref 70–99)
Potassium: 4.6 mmol/L (ref 3.5–5.2)
Sodium: 138 mmol/L (ref 134–144)
Total Protein: 7.4 g/dL (ref 6.0–8.5)
eGFR: 123 mL/min/{1.73_m2} (ref >59)

## 2024-02-10 LAB — CBC WITH DIFFERENTIAL/PLATELET
Basophils Absolute: 0.1 10*3/uL (ref 0.0–0.2)
Basos: 1 %
EOS (ABSOLUTE): 0.3 10*3/uL (ref 0.0–0.4)
Eos: 3 %
Hematocrit: 41 % (ref 34.0–46.6)
Hemoglobin: 13.4 g/dL (ref 11.1–15.9)
Immature Grans (Abs): 0.1 10*3/uL (ref 0.0–0.1)
Immature Granulocytes: 1 %
Lymphocytes Absolute: 2.4 10*3/uL (ref 0.7–3.1)
Lymphs: 26 %
MCH: 28.1 pg (ref 26.6–33.0)
MCHC: 32.7 g/dL (ref 31.5–35.7)
MCV: 86 fL (ref 79–97)
Monocytes Absolute: 0.6 10*3/uL (ref 0.1–0.9)
Monocytes: 6 %
Neutrophils Absolute: 5.8 10*3/uL (ref 1.4–7.0)
Neutrophils: 63 %
Platelets: 411 10*3/uL (ref 150–450)
RBC: 4.77 x10E6/uL (ref 3.77–5.28)
RDW: 13.6 % (ref 11.7–15.4)
WBC: 9 10*3/uL (ref 3.4–10.8)

## 2024-02-10 LAB — HEPATITIS C ANTIBODY: Hep C Virus Ab: NONREACTIVE

## 2024-02-10 LAB — CERVICOVAGINAL ANCILLARY ONLY
Chlamydia: NEGATIVE
Comment: NEGATIVE
Comment: NEGATIVE
Comment: NORMAL
Neisseria Gonorrhea: NEGATIVE
Trichomonas: NEGATIVE

## 2024-02-10 LAB — LIPID PANEL
Chol/HDL Ratio: 3.2 {ratio} (ref 0.0–4.4)
Cholesterol, Total: 169 mg/dL (ref 100–199)
HDL: 53 mg/dL (ref >39)
LDL Chol Calc (NIH): 87 mg/dL (ref 0–99)
Triglycerides: 170 mg/dL — ABNORMAL HIGH (ref 0–149)
VLDL Cholesterol Cal: 29 mg/dL (ref 5–40)

## 2024-02-10 LAB — TSH: TSH: 1.69 u[IU]/mL (ref 0.450–4.500)

## 2024-02-10 LAB — HIV ANTIBODY (ROUTINE TESTING W REFLEX): HIV Screen 4th Generation wRfx: NONREACTIVE

## 2024-02-10 LAB — HEMOGLOBIN A1C
Est. average glucose Bld gHb Est-mCnc: 114 mg/dL
Hgb A1c MFr Bld: 5.6 % (ref 4.8–5.6)

## 2024-02-10 LAB — RPR: RPR Ser Ql: NONREACTIVE

## 2024-02-10 NOTE — Progress Notes (Signed)
 Hi Emma Spencer, Your blood counts are normal.  Your A1c is on the borderline of prediabetes.  Your triglyceride level is also slightly elevated.  Changing of diet, monitoring sugars and carbohydrate intake and regular exercise can help to bring this risk down.  Your hep C and HIV testing was normal and your thyroid was normal as well.  Your liver enzyme was slightly elevated and may be due to increase in dietary changes.  I recommend we recheck in 6 months to keep an eye on your triglycerides, A1c and ALT.  We will send you a reminder in 6 months to come in just for a lab visit.  You have any further questions please let me know

## 2024-02-10 NOTE — Telephone Encounter (Signed)
 Asked patient during the visit with [place] if she wanted to get her pap done here and she said she was probably just going to get it done at [facility].

## 2024-02-13 NOTE — Progress Notes (Signed)
 STI panel is negative

## 2024-11-22 ENCOUNTER — Other Ambulatory Visit (HOSPITAL_BASED_OUTPATIENT_CLINIC_OR_DEPARTMENT_OTHER): Payer: Self-pay

## 2024-11-22 ENCOUNTER — Encounter (HOSPITAL_BASED_OUTPATIENT_CLINIC_OR_DEPARTMENT_OTHER): Payer: Self-pay | Admitting: Family Medicine

## 2024-11-22 ENCOUNTER — Ambulatory Visit (INDEPENDENT_AMBULATORY_CARE_PROVIDER_SITE_OTHER): Admitting: Family Medicine

## 2024-11-22 VITALS — BP 107/73 | HR 78 | Ht 66.0 in | Wt 226.0 lb

## 2024-11-22 DIAGNOSIS — N3001 Acute cystitis with hematuria: Secondary | ICD-10-CM | POA: Diagnosis not present

## 2024-11-22 DIAGNOSIS — B3731 Acute candidiasis of vulva and vagina: Secondary | ICD-10-CM

## 2024-11-22 LAB — POCT URINALYSIS DIP (CLINITEK)
Bilirubin, UA: NEGATIVE
Glucose, UA: NEGATIVE mg/dL
Nitrite, UA: POSITIVE — AB
POC PROTEIN,UA: 30 — AB
Spec Grav, UA: 1.025 (ref 1.010–1.025)
Urobilinogen, UA: 1 U/dL
pH, UA: 6 (ref 5.0–8.0)

## 2024-11-22 MED ORDER — NITROFURANTOIN MONOHYD MACRO 100 MG PO CAPS
100.0000 mg | ORAL_CAPSULE | Freq: Two times a day (BID) | ORAL | 0 refills | Status: AC
  Filled 2024-11-22: qty 10, 5d supply, fill #0

## 2024-11-22 MED FILL — Fluconazole Tab 150 MG: 150.0000 mg | ORAL | 2 days supply | Qty: 2 | Fill #0 | Status: AC

## 2024-11-22 NOTE — Progress Notes (Signed)
 Acute Care Office Visit  Subjective:   Emma Spencer [birth year] 11/22/2024  Chief Complaint  Patient presents with   Vaginal Itching    Pt states she has been having vaginal itching and also thinks she might have a UTI as she has been having some vaginal burning/pain and some odor.    HPI: Discussed the use of AI scribe software for clinical note transcription with the patient, who gave verbal consent to proceed.  History of Present Illness Emma Spencer is a 33 year old female who presents with symptoms of a urinary tract infection and itching.  She has been experiencing severe itching for approximately one week, which has been unresponsive to over-the-counter treatments such as Monistat. The itching is significant enough to disrupt her sleep, causing her to wake up at night.  In the past few days, she developed cramping and burning pain during urination, which led her to schedule an appointment. She also noticed hematuria and initially questioned if it was related to her menstrual cycle, although she has not had her period in a while.  No nausea, vomiting, fevers, or chills.     The following portions of the patient's history were reviewed and updated as appropriate: past medical history, past surgical history, family history, social history, allergies, medications, and problem list.   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   Attention deficit hyperactivity disorder (ADHD), combined type 12/29/2023   Past Medical History:  Diagnosis Date   Anemia    Anxiety    Depression    Normal labor 11/26/2023   NSVD (normal spontaneous vaginal delivery) 11/26/2023   Palpitations 04/19/2016   Second-degree perineal laceration, with delivery 09/15/2016   Term pregnancy 02/27/2019   Past Surgical History:  Procedure  Laterality Date   WISDOM TOOTH EXTRACTION     Family History  Problem Relation Age of Onset   Hypertension Mother    Anxiety disorder Mother    Depression Mother    Hypertension Father    Diabetes Father    Rheum arthritis Maternal Aunt    Multiple sclerosis Maternal Aunt    Anxiety disorder Maternal Aunt    Depression Maternal Aunt    Stomach cancer Maternal Grandmother    Cancer Maternal Grandmother    Heart disease Paternal Grandfather    Arthritis Maternal Aunt    Asthma Maternal Aunt    Outpatient Medications Prior to Visit  Medication Sig Dispense Refill   Prenat w/o A-FE-Methfol-FA-DHA (PNV-DHA) 27-0.6-0.4-300 MG CAPS Take 1 capsule by mouth daily.     methylphenidate (CONCERTA) 27 MG PO CR tablet Take 27 mg by mouth every morning. (Patient not taking: Reported on 11/22/2024)     No facility-administered medications prior to visit.   Allergies[1]   ROS: A complete ROS was performed with pertinent positives/negatives noted in the HPI. The remainder of the ROS are negative.    Objective:   Today's Vitals   11/22/24 0843  BP: 107/73  Pulse: 78  SpO2: 97%  Weight: 226 lb (102.5 kg)  Height: 5' 6 (1.676 m)    GENERAL: Well-appearing, in NAD. Well nourished.  SKIN: Pink, warm and dry. No rash, lesion, ulceration, or ecchymoses.  Head: Normocephalic. NECK: Trachea midline. Full ROM w/o pain or tenderness.  RESPIRATORY: Chest wall symmetrical. Respirations even and non-labored. GI: Abdomen soft, non-tender.  No CVA tenderness.  MSK: Muscle tone and strength appropriate for age.  NEUROLOGIC: No motor or sensory deficits. Steady, even gait. C2-C12 intact.  PSYCH/MENTAL STATUS: Alert, oriented x 3. Cooperative, appropriate mood and affect.    Results for orders placed or performed in visit on 11/22/24  POCT URINALYSIS DIP (CLINITEK)  Result Value Ref Range   Color, UA orange (A) yellow   Clarity, UA clear clear   Glucose, UA negative negative mg/dL    Bilirubin, UA negative negative   Ketones, POC UA trace (5) (A) negative mg/dL   Spec Grav, UA 8.974 8.989 - 1.025   Blood, UA moderate (A) negative   pH, UA 6.0 5.0 - 8.0   POC PROTEIN,UA =30 (A) negative, trace   Urobilinogen, UA 1.0 0.2 or 1.0 E.U./dL   Nitrite, UA Positive (A) Negative   Leukocytes, UA Small (1+) (A) Negative      Assessment & Plan:  1. Acute cystitis with hematuria (Primary) Start Macrobid 100mg  BID x 5 days. Increase clear fluids and limit sugar intake. If no improvement or worsening in 48 hours, reach out to PCP. Will send for culture.  - nitrofurantoin, macrocrystal-monohydrate, (MACROBID) 100 MG capsule; Take 1 capsule (100 mg total) by mouth 2 (two) times daily.  Dispense: 10 capsule; Refill: 0 - Urine Culture - POCT URINALYSIS DIP (CLINITEK)  2. Vaginal yeast infection Will start Diflucan 150mg  following abx for UTI. Discussed preventative measures.  - fluconazole (DIFLUCAN) 150 MG tablet; Take 1 tablet (150 mg total) by mouth once for 1 dose. May repeat after 3 days if needed.  Dispense: 2 tablet; Refill: 0   Meds ordered this encounter  Medications   fluconazole (DIFLUCAN) 150 MG tablet    Sig: Take 1 tablet (150 mg total) by mouth once for 1 dose. May repeat after 3 days if needed.    Dispense:  2 tablet    Refill:  0    Supervising Provider:   DE CUBA, RAYMOND J [[id number]]   nitrofurantoin, macrocrystal-monohydrate, (MACROBID) 100 MG capsule    Sig: Take 1 capsule (100 mg total) by mouth 2 (two) times daily.    Dispense:  10 capsule    Refill:  0    Supervising Provider:   DE CUBA, RAYMOND J [[id number]]   Lab Orders         Urine Culture         POCT URINALYSIS DIP (CLINITEK)     No images are attached to the encounter or orders placed in the encounter.  Return if symptoms worsen or fail to improve.    Patient to reach out to office if new, worrisome, or unresolved symptoms arise or if no improvement in patient's condition. Patient verbalized  understanding and is agreeable to treatment plan. All questions answered to patient's satisfaction.    Thersia Schuyler Stark, FNP      [1] No Known Allergies

## 2024-11-27 ENCOUNTER — Ambulatory Visit (HOSPITAL_BASED_OUTPATIENT_CLINIC_OR_DEPARTMENT_OTHER): Payer: Self-pay | Admitting: Family Medicine

## 2024-11-27 LAB — URINE CULTURE

## 2024-11-27 NOTE — Progress Notes (Signed)
 Urine culture indicates susceptibility to abx. No change to treatment plan.

## 2024-12-24 ENCOUNTER — Ambulatory Visit (HOSPITAL_BASED_OUTPATIENT_CLINIC_OR_DEPARTMENT_OTHER): Admitting: Family Medicine

## 2024-12-25 ENCOUNTER — Other Ambulatory Visit: Payer: Self-pay | Admitting: Medical Genetics

## 2024-12-27 ENCOUNTER — Other Ambulatory Visit: Payer: Self-pay

## 2024-12-27 DIAGNOSIS — Z006 Encounter for examination for normal comparison and control in clinical research program: Secondary | ICD-10-CM

## 2024-12-31 ENCOUNTER — Other Ambulatory Visit (HOSPITAL_COMMUNITY)
Admission: RE | Admit: 2024-12-31 | Discharge: 2024-12-31 | Disposition: A | Discharge status: Home / Self Care | Source: Ambulatory Visit | Attending: Family Medicine | Admitting: Family Medicine

## 2024-12-31 ENCOUNTER — Encounter (HOSPITAL_BASED_OUTPATIENT_CLINIC_OR_DEPARTMENT_OTHER): Payer: Self-pay | Admitting: Family Medicine

## 2024-12-31 ENCOUNTER — Ambulatory Visit (INDEPENDENT_AMBULATORY_CARE_PROVIDER_SITE_OTHER): Admitting: Family Medicine

## 2024-12-31 VITALS — BP 102/57 | HR 79 | Temp 98.4°F | Resp 18 | Ht 66.0 in | Wt 227.0 lb

## 2024-12-31 DIAGNOSIS — N760 Acute vaginitis: Secondary | ICD-10-CM | POA: Insufficient documentation

## 2024-12-31 MED ORDER — NYSTATIN-TRIAMCINOLONE 100000-0.1 UNIT/GM-% EX OINT: 1.0000 | TOPICAL_OINTMENT | Freq: Two times a day (BID) | CUTANEOUS | 0 refills | Status: AC

## 2024-12-31 NOTE — Progress Notes (Signed)
 "    Subjective:   Emma Spencer August 27, 1991 12/31/2024  Chief Complaint  Patient presents with   Vaginitis    Discussed the use of AI scribe software for clinical note transcription with the patient, who gave verbal consent to proceed.  History of Present Illness Emma Spencer is a 34 year old female who presents with persistent vaginal itching after treatment for a UTI and yeast infection.  She was treated for a urinary tract infection on December 11th with Macrobid , followed by Diflucan  for a yeast infection. Despite completing the prescribed two doses of Diflucan , there has been no improvement in symptoms.  She continues to experience significant vaginal itching, described as 'pretty bad', with no discharge noted. The itching is temporarily relieved by using vaginal badges, but the relief is short-lived, lasting only a couple of hours before symptoms return.  However, she has developed sores from scratching, which sometimes burn.  No known exposure to sexually transmitted diseases. Open to screening for STDs.   The following portions of the patient's history were reviewed and updated as appropriate: past medical history, past surgical history, family history, social history, allergies, medications, and problem list.   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   Attention deficit hyperactivity disorder (ADHD), combined type 12/29/2023   Past Medical History:  Diagnosis Date   Anemia    Anxiety    Depression    Normal labor 11/26/2023   NSVD (normal spontaneous vaginal delivery) 11/26/2023   Palpitations 04/19/2016   Second-degree perineal laceration, with delivery 09/15/2016   Term pregnancy 02/27/2019   Past Surgical History:  Procedure Laterality Date   WISDOM TOOTH EXTRACTION     Family History  Problem Relation  Age of Onset   Hypertension Mother    Anxiety disorder Mother    Depression Mother    Hypertension Father    Diabetes Father    Rheum arthritis Maternal Aunt    Multiple sclerosis Maternal Aunt    Anxiety disorder Maternal Aunt    Depression Maternal Aunt    Stomach cancer Maternal Grandmother    Cancer Maternal Grandmother    Heart disease Paternal Grandfather    Arthritis Maternal Aunt    Asthma Maternal Aunt    Outpatient Medications Prior to Visit  Medication Sig Dispense Refill   levonorgestrel (MIRENA) 20 MCG/DAY IUD 1 each by Intrauterine route once.     methylphenidate (CONCERTA) 27 MG PO CR tablet Take 27 mg by mouth every morning.     nitrofurantoin , macrocrystal-monohydrate, (MACROBID ) 100 MG capsule Take 1 capsule (100 mg total) by mouth 2 (two) times daily. (Patient not taking: Reported on 12/31/2024) 10 capsule 0   Prenat w/o A-FE-Methfol-FA-DHA (PNV-DHA) 27-0.6-0.4-300 MG CAPS Take 1 capsule by mouth daily. (Patient not taking: Reported on 12/31/2024)     No facility-administered medications prior to visit.   Allergies[1]   ROS: A complete ROS was performed with pertinent positives/negatives noted in the HPI. The remainder of the ROS are negative.    Objective:   Today's Vitals   12/31/24 0831  BP: (!) 102/57  Pulse: 79  Resp: 18  Temp: 98.4 F (36.9 C)  TempSrc: Oral  SpO2: 99%  Weight: 227 lb (103 kg)  Height: 5' 6 (1.676 m)  PainSc: 0-No pain    Physical Exam   GENERAL: Well-appearing, in NAD. Well nourished.  SKIN: Pink, warm and dry.  Head: Normocephalic. NECK: Trachea midline. Full ROM w/o pain or tenderness.  RESPIRATORY: Chest wall symmetrical. Respirations even and non-labored.  MSK: Muscle tone and strength appropriate for age. GU: External genitalia with moderate erythema and irritation to vulva. No lesions, or masses. No lymphadenopathy. Vaginal mucosa pink and moist without lesions, or ulcerations. Cervix pink with small white  discharge. Cervical os closed.  IUD in place. Uterus and adnexae palpable, not enlarged, and w/o tenderness. No palpable masses. Chaperoned by Rexene Shorter, CMA NEUROLOGIC: No motor or sensory deficits. Steady, even gait. C2-C12 intact.  PSYCH/MENTAL STATUS: Alert, oriented x 3. Cooperative, appropriate mood and affect.      Assessment & Plan:   1. Acute vaginitis (Primary) Vaginal swab completed in office. Will start nystatatin-triamcinolone  ointment BID and will treat for possible BV or yeast pending swab results.  - Cervicovaginal ancillary only - nystatin -triamcinolone  ointment (MYCOLOG); Apply 1 Application topically 2 (two) times daily.  Dispense: 30 g; Refill: 0   Meds ordered this encounter  Medications   nystatin -triamcinolone  ointment (MYCOLOG)    Sig: Apply 1 Application topically 2 (two) times daily.    Dispense:  30 g    Refill:  0    Supervising Provider:   DE CUBA, RAYMOND J [[id number]]   Lab Orders  No laboratory test(s) ordered today    Return if symptoms worsen or fail to improve.    Patient to reach out to office if new, worrisome, or unresolved symptoms arise or if no improvement in patient's condition. Patient verbalized understanding and is agreeable to treatment plan. All questions answered to patient's satisfaction.    Thersia Schuyler Stark, FNP     [1] No Known Allergies  "

## 2025-01-01 ENCOUNTER — Ambulatory Visit (HOSPITAL_BASED_OUTPATIENT_CLINIC_OR_DEPARTMENT_OTHER): Payer: Self-pay | Admitting: Family Medicine

## 2025-01-01 LAB — CERVICOVAGINAL ANCILLARY ONLY
Bacterial Vaginitis (gardnerella): NEGATIVE
Candida Glabrata: NEGATIVE
Candida Vaginitis: NEGATIVE
Chlamydia: NEGATIVE
Comment: NEGATIVE
Comment: NEGATIVE
Comment: NEGATIVE
Comment: NEGATIVE
Comment: NEGATIVE
Comment: NORMAL
Neisseria Gonorrhea: NEGATIVE
Trichomonas: NEGATIVE

## 2025-01-01 NOTE — Progress Notes (Signed)
 Hi Emma Spencer,  Your vaginal swab is negative for BV or yeast. I believe vaginitis (inflammation of the tissue of the vagina) is occurring causing the irritation and itching. I would recommend using the cream provided twice daily for up to 14 days. If no improvement, please let me know.

## 2025-01-11 LAB — GENECONNECT MOLECULAR SCREEN: Genetic Analysis Overall Interpretation: NEGATIVE

## 2025-02-11 ENCOUNTER — Encounter (HOSPITAL_BASED_OUTPATIENT_CLINIC_OR_DEPARTMENT_OTHER): Payer: 59 | Admitting: Family Medicine
